# Patient Record
Sex: Female | Born: 1935 | Race: White | Hispanic: No | Smoking: Former smoker
Health system: Southern US, Community
[De-identification: ages and names within clinical notes are randomized; demographics above are authoritative.]

## PROBLEM LIST (undated history)

## (undated) DIAGNOSIS — I4891 Unspecified atrial fibrillation: Secondary | ICD-10-CM

## (undated) DIAGNOSIS — I472 Ventricular tachycardia: Secondary | ICD-10-CM

## (undated) DIAGNOSIS — I471 Supraventricular tachycardia: Secondary | ICD-10-CM

## (undated) DIAGNOSIS — H269 Unspecified cataract: Secondary | ICD-10-CM

## (undated) DIAGNOSIS — I4719 Other supraventricular tachycardia: Secondary | ICD-10-CM

## (undated) DIAGNOSIS — J45909 Unspecified asthma, uncomplicated: Secondary | ICD-10-CM

## (undated) DIAGNOSIS — I4729 Other ventricular tachycardia: Secondary | ICD-10-CM

## (undated) DIAGNOSIS — I639 Cerebral infarction, unspecified: Secondary | ICD-10-CM

## (undated) HISTORY — DX: Unspecified asthma, uncomplicated: J45.909

## (undated) HISTORY — DX: Unspecified cataract: H26.9

## (undated) HISTORY — DX: Other supraventricular tachycardia: I47.19

## (undated) HISTORY — PX: ABDOMINAL HYSTERECTOMY: SHX81

## (undated) HISTORY — DX: Other ventricular tachycardia: I47.29

## (undated) HISTORY — DX: Supraventricular tachycardia: I47.1

## (undated) HISTORY — DX: Ventricular tachycardia: I47.2

---

## 2000-11-15 ENCOUNTER — Other Ambulatory Visit: Admission: RE | Admit: 2000-11-15 | Discharge: 2000-11-15 | Payer: Self-pay | Admitting: *Deleted

## 2000-11-15 ENCOUNTER — Other Ambulatory Visit: Admission: RE | Admit: 2000-11-15 | Discharge: 2000-12-14 | Payer: Self-pay | Admitting: *Deleted

## 2000-12-09 ENCOUNTER — Ambulatory Visit (HOSPITAL_COMMUNITY): Admission: RE | Admit: 2000-12-09 | Discharge: 2000-12-09 | Payer: Self-pay | Admitting: *Deleted

## 2001-01-08 ENCOUNTER — Encounter (INDEPENDENT_AMBULATORY_CARE_PROVIDER_SITE_OTHER): Payer: Self-pay | Admitting: Specialist

## 2001-01-08 ENCOUNTER — Ambulatory Visit (HOSPITAL_COMMUNITY): Admission: RE | Admit: 2001-01-08 | Discharge: 2001-01-08 | Payer: Self-pay | Admitting: *Deleted

## 2001-07-13 ENCOUNTER — Encounter: Payer: Self-pay | Admitting: Emergency Medicine

## 2001-07-13 ENCOUNTER — Emergency Department (HOSPITAL_COMMUNITY): Admission: EM | Admit: 2001-07-13 | Discharge: 2001-07-13 | Payer: Self-pay | Admitting: Emergency Medicine

## 2001-07-18 ENCOUNTER — Encounter: Payer: Self-pay | Admitting: Orthopedic Surgery

## 2001-07-18 ENCOUNTER — Inpatient Hospital Stay (HOSPITAL_COMMUNITY): Admission: RE | Admit: 2001-07-18 | Discharge: 2001-07-19 | Payer: Self-pay | Admitting: Orthopedic Surgery

## 2001-12-15 ENCOUNTER — Ambulatory Visit (HOSPITAL_COMMUNITY): Admission: RE | Admit: 2001-12-15 | Discharge: 2001-12-15 | Payer: Self-pay | Admitting: *Deleted

## 2002-09-30 ENCOUNTER — Encounter (INDEPENDENT_AMBULATORY_CARE_PROVIDER_SITE_OTHER): Payer: Self-pay | Admitting: Specialist

## 2002-09-30 ENCOUNTER — Ambulatory Visit: Admission: RE | Admit: 2002-09-30 | Discharge: 2002-09-30 | Payer: Self-pay | Admitting: Gynecology

## 2004-04-04 ENCOUNTER — Emergency Department (HOSPITAL_COMMUNITY): Admission: EM | Admit: 2004-04-04 | Discharge: 2004-04-04 | Payer: Self-pay | Admitting: *Deleted

## 2007-11-24 ENCOUNTER — Other Ambulatory Visit: Admission: RE | Admit: 2007-11-24 | Discharge: 2007-11-24 | Payer: Self-pay | Admitting: Obstetrics and Gynecology

## 2011-02-02 NOTE — Op Note (Signed)
[facility]  Patient:    Lynn Hardy, Lynn Hardy Visit Number: [id number] MRN: [id number]          Service Type: SUR Location: 4W [id number] Attending Physician:  Sherri Rad Proc. Date: 07/18/01 Admit Date:  07/18/2001 Discharge Date: 07/19/2001                             Operative Report  PREOPERATIVE DIAGNOSIS:  Comminuted intraarticular right proximal phalanx great toe fracture.  POSTOPERATIVE DIAGNOSIS:  Comminuted intraarticular right proximal phalanx great toe fracture.  OPERATION:  Open reduction, internal fixation right proximal phalanx great toe intraarticular fracture.  ANESTHESIA:  General endotracheal tube.  SURGEON:  Sherri Rad, M.D.  ASSISTANT:  Ottie Glazier. Wynona Neat, P.A.-C.  ESTIMATED BLOOD LOSS:  Minimal.  TOURNIQUET TIME:  37 minutes.  COMPLICATIONS:  None.  DISPOSITION:  Stable to PR.  INDICATIONS:  This 77 year old female, who slipped and fell and had immediate pain and swelling in her right great toe.  She was seen in the office where she was evaluated and x-rays were studied and showed that she had a comminuted intraarticular proximal phalanx fracture.  There was a fragment that was plantar lateral that was significantly displaced intraarticular.  She was consented for the above procedure, and all risks which included infection neurovascular injury, malunion, nonunion, arthritis, persistent pain, and stiffness were all explained.  Questions were answered.  DESCRIPTION OF PROCEDURE:  The patient was brought to the operating room and placed in the supine position.  After adequate general endotracheal tube anesthesia was administered as well as Ancef 1 g IV piggyback, the right lower extremity was then prepped and draped in a sterile manner over a proximally-placed thigh tourniquet.  The tourniquet was gravity-exsanguinated, and the tourniquet was elevated to 290 mmHg.  A longitudinal incision on  the dorsolateral aspect of the proximal phalanx was then made.  Dissection was carried down bluntly to bone using a freer elevator as well as a mosquito clamp.  The fragment was identified by C-arm guidance in the oblique plane using a freer.  Then a 2. Pearlean Brownie reduction clamp was then placed at the area of the fracture site under C-arm guidance.  A percutaneous incision was then made on the dorsomedial aspect of the proximal phalanx, and the Pearlean Brownie reduction clamp was then anchored in this area.  The incision dorsomedial was then made. Dissection was carried down to the proximal phalanx under C-arm guidance with a mosquito clamp.  Once this was done, a cannulated 1.1 mm threaded K-wire was then placed for attenuated screw.  Once this was placed under C-arm guidance with the wire perpendicular to the C-arm device in the AP and lateral planes with the fracture reduced with the 2. reduction clamp, this was then measured to be 16 mm x 16 mm 3.0 meter cannulated Synthese screw was then placed.  This was then placed under C-arm guidance in AP, lateral, and oblique planes.  This held the joint reduced nicely.  We then copiously irrigated the wound with normal saline.  K-wire was removed.  Final x-rays were obtained in the AP, lateral, and oblique planes.  We closed the skin with 4-0 nylon.  Sterile dressing was applied.  Hard sole shoe was applied.  The patient was stable. Attending Physician:  Sherri Rad DD:  07/18/01 TD:  07/21/01 Job: [id number]

## 2011-02-02 NOTE — H&P (Signed)
[facility]  Patient:    Lynn Hardy, Lynn Hardy Visit Number: [id number] MRN: [id number]          Service Type: Attending:  Sherri Rad, M.D. Dictated by:   Marcie Bal Troncale, P.A.C. Adm. Date:  07/18/01                           History and Physical  DATE OF BIRTH:  07-20-1931  SSN:  [id number]  CHIEF COMPLAINT:  Right great toe injury.  HISTORY OF PRESENT ILLNESS:  Lynn Hardy is a 77 year old female who tripped in her driveway on July 12, 2001, sustaining an injury to her right great toe.  She was seen the following day in the emergency department and had x-rays obtained.  She was referred over to Dr. Henrietta Dine office for further evaluation and treatment.  X-rays demonstrated a multiplantar fracture of the right great toe proximal phalanx.  Because of the nature of the fracture it was recommended she undergo surgical intervention.  REVIEW OF SYSTEMS:  She denies any recent fever or chills.  No diplopia, blurred vision, or headaches.  No rhinorrhea, sore throat, or earaches.  No chest pain, shortness of breath, or cough.  No abdominal pian, nausea, vomiting, diarrhea, or constipation.  No melena or bright red blood per rectum.  No urinary frequency, dysuria, or hematuria.  No numbness or tingling in her extremities.  PAST MEDICAL HISTORY:  Significant for hypertension and local skin cancer that was removed recently.  Denies strokes, seizures, diabetes, coronary artery disease, and lung or kidney disease.  PAST SURGICAL HISTORY:  Hysterectomy and tonsillectomy.  ALLERGIES:  No known drug allergies.  CURRENT MEDICATIONS:  Enalapril 5 mg q.d.  FAMILY HISTORY:  Significant for an MI in her mother at age 60.  SOCIAL HISTORY:  The patient is divorced.  She has two adult children who are alive and well.  She is retired.  She quit smoking about seven years ago.  No alcohol use.  Her primary medical physician is Dr.  Johnsie Kindred.  PHYSICAL EXAMINATION:  VITAL SIGNS:  She is afebrile.  Pulse is 56, respiratory 18, blood pressure 150/72.  GENERAL:  This is a well-developed, well-nourished female in no acute distress.  HEENT:  Head is atraumatic, normocephalic.  Pupils are equal, round, and reactive to light.  Extraocular movements are grossly intact.  Oropharynx is clear without redness, exudate, or lesions.  NECK:  Supple with no cervical lymphadenopathy.  CHEST:  Clear to auscultation bilaterally with no wheezes or crackles.  HEART:  Regular rate and rhythm with a 2/6 systolic ejection murmur.  ABDOMEN:  Soft, nontender, nondistended with no masses, no hepatosplenomegaly.  BREASTS/GENITOURINARY:  Not examined, not pertinent to present illness.  SKIN:  Intact without rashes, lesions, or abrasions.  Cap refill less than two seconds.  EXTREMITIES:  Toe is with ecchymosis and swelling, but no clinical deformity.  IMPRESSION: 1. Right proximal phalanx, great toe fracture. 2. Hypertension.  PLAN:  The patient will be admitted to [facility] to undergo an open reduction internal fixation, right great toe, proximal phalanx fracture by Dr. Lestine Box on July 18, 2001.  All questions have been encouraged and answered. ictated by:   Marcie Bal Troncale, P.A.C. Attending:  Sherri Rad, M.D. DD:  07/17/01 TD:  07/17/01 Job: [id number]

## 2011-02-02 NOTE — Consult Note (Signed)
NAME:  Lynn Hardy, Lynn Hardy                ACCOUNT NO.:  [id number]   MEDICAL RECORD NO.:  [id number]                   PATIENT TYPE:  OUT   LOCATION:  GYN                                  FACILITY:  [facility]   PHYSICIAN:  De Blanch, M.D.         DATE OF BIRTH:  07/29/1931   DATE OF CONSULTATION:  09/30/2002  DATE OF DISCHARGE:                                   CONSULTATION   HISTORY OF PRESENT ILLNESS:  The patient is a 77 year old who was seen in  consultation at the request of Dr. Myrlene Broker regarding vulvar and perianal  lesions.  The patient apparently developed lesions of the vulva and perianal  region approximately a year ago.  On initial evaluation she was thought to  have condylomata acuminata and was treated with trichloracetic acid weekly  in March and April of 2003.  She had some cryotherapy of residual lesions on  the vulva and perianal region in May 2003.  Additional treatment with TCA in  June resulted in good response to the vulvar lesions except for an area of  perirectal abnormality.  Biopsies were obtained in March and May, revealing  condylomata.  A subsequent biopsy in August 2003 revealed only nonsquamous  hyperplasia and hyperkeratosis with no evidence of  dysplasia.  Subsequently, the patient was treated with Temovate with apparent partial  response to the region in the perianal area.   The patient herself is entirely asymptomatic, denying any vulvar pain,  pruritus, or any other symptoms.   PAST MEDICAL HISTORY:  Hypertension.  The remainder of the patient's past  medical history is negative.   CURRENT MEDICATIONS:  Enalapril.   FAMILY HISTORY:  Cutaneous papillomas, and the patient herself has had some  removed.   SOCIAL HISTORY:  The patient has been divorced for 23 years.   PHYSICAL EXAMINATION:  VITAL SIGNS:  Weight 200 pounds.  Blood pressure  156/90, pulse 76, respiratory rate 20.  Height 5 feet 2 inches.  GENERAL:  Healthy white  female in no acute distress.  NECK:  Supple.  Without thyromegaly.  There is no supraclavicular or  inguinal adenopathy.  ABDOMEN:  Soft and nontender.  No masses, organomegaly, ascites, or hernias  are noted.  PELVIC:  EGBUS appears essentially normal.  There is, however, a small area  of white condylomata measuring approximately 5 x 7 mm on the anterior right  labia minora near the clitoris.  The remainder of the vulva is normal.  In  the perianal region, however, essentially posterior to the anus and along  the natal cleft is an erythematous plaque-like series of lesions that seem  to be in some areas confluent and in others separate.  These do not extend  into the anus.  The vagina is normal.   PROCEDURE NOTE:  After preparation with alcohol, the skin was infiltrated  with 1% Xylocaine in the perianal region, and a punch biopsy was obtained.  Hemostasis was achieved with silver nitrate.  IMPRESSION:  Unusual posterior perianal lesion of questionable origin;  therefore, a biopsy has been obtained.  We will await these results before  further recommendations.   With regard to the small area of the anterior right labia minora, I believe  this is a condylomata which could be treated as previously by Dr. Katrinka Blazing.                                                 De Blanch, M.D.    DC/MEDQ  D:  09/30/2002  T:  09/30/2002  Job:  [id number]   cc:   Laqueta Linden, M.D.  [address]  [place]  [place] [address]  Fax: [phone]   Telford Nab, R.N.  [address] [place], [place] [address]  Fax: 1

## 2011-02-02 NOTE — Procedures (Signed)
[facility]  Patient:    Lynn Hardy, Lynn Hardy                  MRN: [id number] Proc. Date: 01/08/01 Adm. Date:  [id number] Attending:  Sabino Gasser                           Procedure Report  PROCEDURE:  Colonoscopy with polypectomy.  INDICATIONS FOR PROCEDURE:  Colon polyp.  ANESTHESIA:  Demerol 70, Versed 6.5 mg.  DESCRIPTION OF PROCEDURE:  With the patient mildly sedated in the left lateral decubitus position, the Olympus videoscopic colonoscope was inserted in the rectum and passed under direct vision to the cecum identified by the ileocecal valve and appendiceal orifice. From this point, the colonoscope was slowly withdrawn after taking a look at the terminal ileum as well which appeared normal. From this point, the colonoscope was then slowly withdrawn taking circumferential views of the entire colonic mucosa stopping then only in the rectum. Once in the sigmoid at approximately 20 cm from the anal verge at which point a polyp was seen approximately 1 cm in size photographed and removed using snare cautery technique on a setting of 20:20 blended current. There was good hemostasis and the polyp was removed and tissue obtained. The endoscope was then withdrawn to the rectum which appeared normal in direct and showed internal hemorrhoids in retroflexed view. The endoscope was straightened and withdrawn. The patients vital signs and pulse oximeter remained stable. The patient tolerated the procedure well without apparent complications.  FINDINGS:  Polyp at 20 cm from the anal verge. Await biopsy report. The patient will call me for results and followup with me in as an outpatient. DD:  01/08/01 TD:  01/08/01 Job: [id number]

## 2011-10-01 DIAGNOSIS — Z23 Encounter for immunization: Secondary | ICD-10-CM | POA: Diagnosis not present

## 2011-11-01 DIAGNOSIS — R5383 Other fatigue: Secondary | ICD-10-CM | POA: Diagnosis not present

## 2011-11-01 DIAGNOSIS — I10 Essential (primary) hypertension: Secondary | ICD-10-CM | POA: Diagnosis not present

## 2011-11-01 DIAGNOSIS — N390 Urinary tract infection, site not specified: Secondary | ICD-10-CM | POA: Diagnosis not present

## 2011-11-01 DIAGNOSIS — E119 Type 2 diabetes mellitus without complications: Secondary | ICD-10-CM | POA: Diagnosis not present

## 2011-11-01 DIAGNOSIS — Z0000 Encounter for general adult medical examination without abnormal findings: Secondary | ICD-10-CM | POA: Diagnosis not present

## 2011-11-01 DIAGNOSIS — R5381 Other malaise: Secondary | ICD-10-CM | POA: Diagnosis not present

## 2011-11-08 DIAGNOSIS — M999 Biomechanical lesion, unspecified: Secondary | ICD-10-CM | POA: Diagnosis not present

## 2011-11-08 DIAGNOSIS — M5137 Other intervertebral disc degeneration, lumbosacral region: Secondary | ICD-10-CM | POA: Diagnosis not present

## 2011-11-21 DIAGNOSIS — E119 Type 2 diabetes mellitus without complications: Secondary | ICD-10-CM | POA: Diagnosis not present

## 2011-11-21 DIAGNOSIS — E782 Mixed hyperlipidemia: Secondary | ICD-10-CM | POA: Diagnosis not present

## 2011-11-21 DIAGNOSIS — I10 Essential (primary) hypertension: Secondary | ICD-10-CM | POA: Diagnosis not present

## 2011-11-21 DIAGNOSIS — H908 Mixed conductive and sensorineural hearing loss, unspecified: Secondary | ICD-10-CM | POA: Diagnosis not present

## 2011-11-21 DIAGNOSIS — I052 Rheumatic mitral stenosis with insufficiency: Secondary | ICD-10-CM | POA: Diagnosis not present

## 2011-12-04 DIAGNOSIS — M999 Biomechanical lesion, unspecified: Secondary | ICD-10-CM | POA: Diagnosis not present

## 2011-12-04 DIAGNOSIS — M5137 Other intervertebral disc degeneration, lumbosacral region: Secondary | ICD-10-CM | POA: Diagnosis not present

## 2012-01-01 DIAGNOSIS — Z961 Presence of intraocular lens: Secondary | ICD-10-CM | POA: Diagnosis not present

## 2012-02-18 DIAGNOSIS — M5137 Other intervertebral disc degeneration, lumbosacral region: Secondary | ICD-10-CM | POA: Diagnosis not present

## 2012-02-18 DIAGNOSIS — M999 Biomechanical lesion, unspecified: Secondary | ICD-10-CM | POA: Diagnosis not present

## 2012-02-20 DIAGNOSIS — R5381 Other malaise: Secondary | ICD-10-CM | POA: Diagnosis not present

## 2012-02-20 DIAGNOSIS — E119 Type 2 diabetes mellitus without complications: Secondary | ICD-10-CM | POA: Diagnosis not present

## 2012-02-20 DIAGNOSIS — I10 Essential (primary) hypertension: Secondary | ICD-10-CM | POA: Diagnosis not present

## 2012-02-20 DIAGNOSIS — R5383 Other fatigue: Secondary | ICD-10-CM | POA: Diagnosis not present

## 2012-02-20 DIAGNOSIS — E782 Mixed hyperlipidemia: Secondary | ICD-10-CM | POA: Diagnosis not present

## 2012-02-27 DIAGNOSIS — I052 Rheumatic mitral stenosis with insufficiency: Secondary | ICD-10-CM | POA: Diagnosis not present

## 2012-02-27 DIAGNOSIS — M159 Polyosteoarthritis, unspecified: Secondary | ICD-10-CM | POA: Diagnosis not present

## 2012-02-27 DIAGNOSIS — E782 Mixed hyperlipidemia: Secondary | ICD-10-CM | POA: Diagnosis not present

## 2012-02-27 DIAGNOSIS — I10 Essential (primary) hypertension: Secondary | ICD-10-CM | POA: Diagnosis not present

## 2012-03-10 DIAGNOSIS — M5137 Other intervertebral disc degeneration, lumbosacral region: Secondary | ICD-10-CM | POA: Diagnosis not present

## 2012-03-10 DIAGNOSIS — M999 Biomechanical lesion, unspecified: Secondary | ICD-10-CM | POA: Diagnosis not present

## 2012-05-13 DIAGNOSIS — D235 Other benign neoplasm of skin of trunk: Secondary | ICD-10-CM | POA: Diagnosis not present

## 2012-05-13 DIAGNOSIS — D1801 Hemangioma of skin and subcutaneous tissue: Secondary | ICD-10-CM | POA: Diagnosis not present

## 2012-05-13 DIAGNOSIS — L820 Inflamed seborrheic keratosis: Secondary | ICD-10-CM | POA: Diagnosis not present

## 2012-05-21 DIAGNOSIS — Z961 Presence of intraocular lens: Secondary | ICD-10-CM | POA: Diagnosis not present

## 2012-05-21 DIAGNOSIS — M999 Biomechanical lesion, unspecified: Secondary | ICD-10-CM | POA: Diagnosis not present

## 2012-05-21 DIAGNOSIS — M5137 Other intervertebral disc degeneration, lumbosacral region: Secondary | ICD-10-CM | POA: Diagnosis not present

## 2012-06-25 DIAGNOSIS — M5137 Other intervertebral disc degeneration, lumbosacral region: Secondary | ICD-10-CM | POA: Diagnosis not present

## 2012-06-25 DIAGNOSIS — M999 Biomechanical lesion, unspecified: Secondary | ICD-10-CM | POA: Diagnosis not present

## 2012-07-14 DIAGNOSIS — M5137 Other intervertebral disc degeneration, lumbosacral region: Secondary | ICD-10-CM | POA: Diagnosis not present

## 2012-07-14 DIAGNOSIS — M999 Biomechanical lesion, unspecified: Secondary | ICD-10-CM | POA: Diagnosis not present

## 2012-07-28 DIAGNOSIS — I10 Essential (primary) hypertension: Secondary | ICD-10-CM | POA: Diagnosis not present

## 2012-07-28 DIAGNOSIS — N393 Stress incontinence (female) (male): Secondary | ICD-10-CM | POA: Diagnosis not present

## 2012-07-28 DIAGNOSIS — E782 Mixed hyperlipidemia: Secondary | ICD-10-CM | POA: Diagnosis not present

## 2012-08-04 DIAGNOSIS — R5381 Other malaise: Secondary | ICD-10-CM | POA: Diagnosis not present

## 2012-08-04 DIAGNOSIS — R5383 Other fatigue: Secondary | ICD-10-CM | POA: Diagnosis not present

## 2012-08-04 DIAGNOSIS — E782 Mixed hyperlipidemia: Secondary | ICD-10-CM | POA: Diagnosis not present

## 2012-08-04 DIAGNOSIS — Z23 Encounter for immunization: Secondary | ICD-10-CM | POA: Diagnosis not present

## 2012-08-04 DIAGNOSIS — E119 Type 2 diabetes mellitus without complications: Secondary | ICD-10-CM | POA: Diagnosis not present

## 2012-08-04 DIAGNOSIS — I10 Essential (primary) hypertension: Secondary | ICD-10-CM | POA: Diagnosis not present

## 2012-08-22 DIAGNOSIS — M999 Biomechanical lesion, unspecified: Secondary | ICD-10-CM | POA: Diagnosis not present

## 2012-08-22 DIAGNOSIS — M5137 Other intervertebral disc degeneration, lumbosacral region: Secondary | ICD-10-CM | POA: Diagnosis not present

## 2012-08-26 DIAGNOSIS — R002 Palpitations: Secondary | ICD-10-CM | POA: Diagnosis not present

## 2012-08-28 ENCOUNTER — Ambulatory Visit (HOSPITAL_COMMUNITY)
Admission: RE | Admit: 2012-08-28 | Discharge: 2012-08-28 | Disposition: A | Discharge status: Home / Self Care | Payer: Medicare Other | Source: Ambulatory Visit | Attending: Cardiovascular Disease | Admitting: Cardiovascular Disease

## 2012-08-28 ENCOUNTER — Other Ambulatory Visit (HOSPITAL_COMMUNITY): Payer: Self-pay | Admitting: Cardiovascular Disease

## 2012-08-28 DIAGNOSIS — I059 Rheumatic mitral valve disease, unspecified: Secondary | ICD-10-CM | POA: Diagnosis not present

## 2012-08-28 DIAGNOSIS — R011 Cardiac murmur, unspecified: Secondary | ICD-10-CM

## 2012-08-28 DIAGNOSIS — Z8249 Family history of ischemic heart disease and other diseases of the circulatory system: Secondary | ICD-10-CM | POA: Diagnosis not present

## 2012-08-28 DIAGNOSIS — I10 Essential (primary) hypertension: Secondary | ICD-10-CM | POA: Diagnosis not present

## 2012-08-28 DIAGNOSIS — I079 Rheumatic tricuspid valve disease, unspecified: Secondary | ICD-10-CM | POA: Insufficient documentation

## 2012-08-28 NOTE — Progress Notes (Signed)
2D Echo Performed 08/28/2012    Kinzey Sheriff, RCS  

## 2012-09-08 DIAGNOSIS — R011 Cardiac murmur, unspecified: Secondary | ICD-10-CM | POA: Diagnosis not present

## 2012-09-30 DIAGNOSIS — I10 Essential (primary) hypertension: Secondary | ICD-10-CM | POA: Diagnosis not present

## 2012-09-30 DIAGNOSIS — I472 Ventricular tachycardia: Secondary | ICD-10-CM | POA: Diagnosis not present

## 2012-10-06 ENCOUNTER — Other Ambulatory Visit (HOSPITAL_COMMUNITY): Payer: Self-pay | Admitting: Cardiovascular Disease

## 2012-10-06 DIAGNOSIS — I472 Ventricular tachycardia: Secondary | ICD-10-CM

## 2012-10-06 DIAGNOSIS — I471 Supraventricular tachycardia: Secondary | ICD-10-CM

## 2012-10-10 ENCOUNTER — Ambulatory Visit (HOSPITAL_COMMUNITY)
Admission: RE | Admit: 2012-10-10 | Discharge: 2012-10-10 | Disposition: A | Discharge status: Home / Self Care | Payer: Medicare Other | Source: Ambulatory Visit | Attending: Cardiovascular Disease | Admitting: Cardiovascular Disease

## 2012-10-10 DIAGNOSIS — R0789 Other chest pain: Secondary | ICD-10-CM | POA: Insufficient documentation

## 2012-10-10 DIAGNOSIS — I472 Ventricular tachycardia: Secondary | ICD-10-CM

## 2012-10-10 DIAGNOSIS — I10 Essential (primary) hypertension: Secondary | ICD-10-CM | POA: Insufficient documentation

## 2012-10-10 DIAGNOSIS — R55 Syncope and collapse: Secondary | ICD-10-CM | POA: Insufficient documentation

## 2012-10-10 DIAGNOSIS — R42 Dizziness and giddiness: Secondary | ICD-10-CM | POA: Insufficient documentation

## 2012-10-10 DIAGNOSIS — I471 Supraventricular tachycardia: Secondary | ICD-10-CM

## 2012-10-10 MED ORDER — REGADENOSON 0.4 MG/5ML IV SOLN
0.4000 mg | Freq: Once | INTRAVENOUS | Status: AC
  Administered 2012-10-10: 0.4 mg via INTRAVENOUS

## 2012-10-10 MED ORDER — TECHNETIUM TC 99M SESTAMIBI GENERIC - CARDIOLITE
31.0000 | Freq: Once | INTRAVENOUS | Status: AC | PRN
  Administered 2012-10-10: 31 via INTRAVENOUS

## 2012-10-10 MED ORDER — TECHNETIUM TC 99M SESTAMIBI GENERIC - CARDIOLITE
11.0000 | Freq: Once | INTRAVENOUS | Status: AC | PRN
  Administered 2012-10-10: 11 via INTRAVENOUS

## 2012-10-10 NOTE — Procedures (Addendum)
[facility] [address] [place] [address] [place] [address] [phone]  Cardiology Nuclear Med Study  Lynn Hardy is a 79 y.o. female     MRN : [id number]     DOB: 10/25/1930  Procedure Date: 10/10/2012  Nuclear Med Background Indication for Stress Test:  Evaluation for Ischemia History:  no prior cardiac history Cardiac Risk Factors: History of Smoking, Hypertension and Obesity  Symptoms:  Dizziness, Near Syncope and chest heaviness   Nuclear Pre-Procedure Caffeine/Decaff Intake:  7:00pm NPO After: 5:00am   IV Site: R Antecubital  IV 0.9% NS with Angio Cath:  22g  Chest Size (in):  n/a IV Started by: Koren Shiver, CNMT  Height: 5\' 2"  (1.575 m)  Cup Size: C  BMI:  Body mass index is 38.04 kg/(m^2). Weight:  208 lb (94.348 kg)   Tech Comments:  n/a    Nuclear Med Study 1 or 2 day study: 1 day  Stress Test Type:  Lexiscan  Order Authorizing Provider:  Thurmon Fair, MD   Resting Radionuclide: Technetium 64m Sestamibi  Resting Radionuclide Dose: 11.0 mCi   Stress Radionuclide:  Technetium 19m Sestamibi  Stress Radionuclide Dose: 31.0 mCi           Stress Protocol Rest HR: 67 Stress HR: 85  Rest BP: 167/101 Stress BP: 183/98  Exercise Time (min): n/a METS: n/a   Predicted Max HR: 141 bpm % Max HR: 60.28 bpm Rate Pressure Product: 60630   Dose of Adenosine (mg):  n/a Dose of Lexiscan: 0.4 mg  Dose of Atropine (mg): n/a Dose of Dobutamine: n/a mcg/kg/min (at max HR)  Stress Test Technologist: Esperanza Sheets, CCT Nuclear Technologist: Gonzella Lex, CNMT   Rest Procedure:  Myocardial perfusion imaging was performed at rest 45 minutes following the intravenous administration of Technetium 29m Sestamibi. Stress Procedure:  The patient received IV Lexiscan 0.4 mg over 15-seconds.  Technetium 51m Sestamibi injected at 30-seconds.  There were no significant changes with Lexiscan.  Quantitative spect images were  obtained after a 45 minute delay.  Transient Ischemic Dilatation (Normal <1.22):  1.10 Lung/Heart Ratio (Normal <0.45):  0.25 QGS EDV:  73 ml QGS ESV:  24 ml LV Ejection Fraction: 67%     Rest ECG: NSR - Normal EKG  Stress ECG: No significant change from baseline ECG  QPS Raw Data Images:  Mild diaphragmatic attenuation.  Normal left ventricular size. Stress Images:  There is decreased uptake in the inferior wall. Rest Images:  Comparison with the stress images reveals no significant change. Subtraction (SDS):  No evidence of ischemia.  Impression Exercise Capacity:  Lexiscan with no exercise. BP Response:  Hypertensive blood pressure response. Clinical Symptoms:  No significant symptoms noted. ECG Impression:  No significant ST segment change suggestive of ischemia. Comparison with Prior Nuclear Study: No previous nuclear study performed  Overall Impression:  Normal stress nuclear study. Mild diaphragmatic attenuation artifact.  LV Wall Motion:  NL LV Function; NL Wall Motion   Alezander Dimaano, MD  10/10/2012 12:21 PM

## 2012-11-11 DIAGNOSIS — I10 Essential (primary) hypertension: Secondary | ICD-10-CM | POA: Diagnosis not present

## 2012-11-11 DIAGNOSIS — I472 Ventricular tachycardia: Secondary | ICD-10-CM | POA: Diagnosis not present

## 2012-11-11 DIAGNOSIS — I498 Other specified cardiac arrhythmias: Secondary | ICD-10-CM | POA: Diagnosis not present

## 2012-11-11 DIAGNOSIS — M999 Biomechanical lesion, unspecified: Secondary | ICD-10-CM | POA: Diagnosis not present

## 2012-11-11 DIAGNOSIS — M5137 Other intervertebral disc degeneration, lumbosacral region: Secondary | ICD-10-CM | POA: Diagnosis not present

## 2012-11-17 DIAGNOSIS — E119 Type 2 diabetes mellitus without complications: Secondary | ICD-10-CM | POA: Diagnosis not present

## 2012-11-17 DIAGNOSIS — I10 Essential (primary) hypertension: Secondary | ICD-10-CM | POA: Diagnosis not present

## 2012-11-17 DIAGNOSIS — E782 Mixed hyperlipidemia: Secondary | ICD-10-CM | POA: Diagnosis not present

## 2012-11-17 DIAGNOSIS — Z0000 Encounter for general adult medical examination without abnormal findings: Secondary | ICD-10-CM | POA: Diagnosis not present

## 2012-11-17 DIAGNOSIS — R5381 Other malaise: Secondary | ICD-10-CM | POA: Diagnosis not present

## 2012-11-17 DIAGNOSIS — Z1331 Encounter for screening for depression: Secondary | ICD-10-CM | POA: Diagnosis not present

## 2012-11-17 DIAGNOSIS — N951 Menopausal and female climacteric states: Secondary | ICD-10-CM | POA: Diagnosis not present

## 2012-11-17 DIAGNOSIS — R5383 Other fatigue: Secondary | ICD-10-CM | POA: Diagnosis not present

## 2012-12-12 DIAGNOSIS — N951 Menopausal and female climacteric states: Secondary | ICD-10-CM | POA: Diagnosis not present

## 2012-12-12 DIAGNOSIS — Z23 Encounter for immunization: Secondary | ICD-10-CM | POA: Diagnosis not present

## 2012-12-12 DIAGNOSIS — I10 Essential (primary) hypertension: Secondary | ICD-10-CM | POA: Diagnosis not present

## 2012-12-12 DIAGNOSIS — H908 Mixed conductive and sensorineural hearing loss, unspecified: Secondary | ICD-10-CM | POA: Diagnosis not present

## 2012-12-12 DIAGNOSIS — E782 Mixed hyperlipidemia: Secondary | ICD-10-CM | POA: Diagnosis not present

## 2012-12-12 DIAGNOSIS — M159 Polyosteoarthritis, unspecified: Secondary | ICD-10-CM | POA: Diagnosis not present

## 2012-12-12 DIAGNOSIS — E119 Type 2 diabetes mellitus without complications: Secondary | ICD-10-CM | POA: Diagnosis not present

## 2013-01-07 DIAGNOSIS — M999 Biomechanical lesion, unspecified: Secondary | ICD-10-CM | POA: Diagnosis not present

## 2013-01-07 DIAGNOSIS — M5137 Other intervertebral disc degeneration, lumbosacral region: Secondary | ICD-10-CM | POA: Diagnosis not present

## 2013-01-09 DIAGNOSIS — M5137 Other intervertebral disc degeneration, lumbosacral region: Secondary | ICD-10-CM | POA: Diagnosis not present

## 2013-01-09 DIAGNOSIS — M999 Biomechanical lesion, unspecified: Secondary | ICD-10-CM | POA: Diagnosis not present

## 2013-01-19 ENCOUNTER — Encounter: Payer: Self-pay | Admitting: Cardiovascular Disease

## 2013-02-02 DIAGNOSIS — M999 Biomechanical lesion, unspecified: Secondary | ICD-10-CM | POA: Diagnosis not present

## 2013-02-02 DIAGNOSIS — M5137 Other intervertebral disc degeneration, lumbosacral region: Secondary | ICD-10-CM | POA: Diagnosis not present

## 2013-02-04 ENCOUNTER — Other Ambulatory Visit: Payer: Self-pay | Admitting: Cardiovascular Disease

## 2013-02-04 DIAGNOSIS — M5137 Other intervertebral disc degeneration, lumbosacral region: Secondary | ICD-10-CM | POA: Diagnosis not present

## 2013-02-04 DIAGNOSIS — M999 Biomechanical lesion, unspecified: Secondary | ICD-10-CM | POA: Diagnosis not present

## 2013-02-06 DIAGNOSIS — M999 Biomechanical lesion, unspecified: Secondary | ICD-10-CM | POA: Diagnosis not present

## 2013-02-06 DIAGNOSIS — M5137 Other intervertebral disc degeneration, lumbosacral region: Secondary | ICD-10-CM | POA: Diagnosis not present

## 2013-02-18 DIAGNOSIS — M999 Biomechanical lesion, unspecified: Secondary | ICD-10-CM | POA: Diagnosis not present

## 2013-02-18 DIAGNOSIS — M5137 Other intervertebral disc degeneration, lumbosacral region: Secondary | ICD-10-CM | POA: Diagnosis not present

## 2013-05-06 ENCOUNTER — Encounter: Payer: Self-pay | Admitting: Cardiology

## 2013-05-08 ENCOUNTER — Ambulatory Visit (INDEPENDENT_AMBULATORY_CARE_PROVIDER_SITE_OTHER): Payer: Medicare Other | Admitting: Cardiovascular Disease

## 2013-05-08 ENCOUNTER — Encounter: Payer: Self-pay | Admitting: Cardiovascular Disease

## 2013-05-08 VITALS — BP 160/100 | HR 74 | Resp 16 | Ht 62.0 in | Wt 214.0 lb

## 2013-05-08 DIAGNOSIS — R609 Edema, unspecified: Secondary | ICD-10-CM

## 2013-05-08 DIAGNOSIS — I471 Supraventricular tachycardia: Secondary | ICD-10-CM

## 2013-05-08 DIAGNOSIS — I10 Essential (primary) hypertension: Secondary | ICD-10-CM | POA: Diagnosis not present

## 2013-05-08 DIAGNOSIS — I472 Ventricular tachycardia: Secondary | ICD-10-CM | POA: Diagnosis not present

## 2013-05-08 DIAGNOSIS — I498 Other specified cardiac arrhythmias: Secondary | ICD-10-CM

## 2013-05-08 MED ORDER — HYDROCHLOROTHIAZIDE 12.5 MG PO CAPS: 12.5000 mg | ORAL_CAPSULE | Freq: Every day | ORAL | Status: DC

## 2013-05-08 NOTE — Assessment & Plan Note (Signed)
Incidentally discovered on event monitor, asymptomatic, normal echo and nuclear stress test.

## 2013-05-08 NOTE — Assessment & Plan Note (Addendum)
Her blood pressure is high and this appears to be a consistent problem, even outside of the current illness. I have added hydrochlorothiazide 12.5 mg once daily. She has a followup appointment with her primary care physician in one month. That will be an optimal opportunity to reevaluate her blood pressure. Also discussed with her the importance of avoiding sodium rich foods such as a Campbell[employer] chicken soup that she is currently eating. She should increase her intake of potassium rich foods to avoid hydrochlorothiazide-induced hypokalemia.

## 2013-05-08 NOTE — Assessment & Plan Note (Signed)
No symptomatic events. 

## 2013-05-08 NOTE — Patient Instructions (Addendum)
Your physician recommends that you schedule a follow-up appointment in: 12 months Your physician has recommended you make the following change in your medication: start hydrochlorothiazide 12.5 mg daily. Try to avoid sodium rich food and added salt. Increase intake of potassium rich foods, such as fruit and vegetables.

## 2013-05-08 NOTE — Progress Notes (Signed)
Patient ID: Lynn Hardy, female   DOB: 1931-05-27, 79 y.o.   MRN: [id number]     Reason for office visit Followup presyncope, arrhythmia, hypertension  Lynn Hardy had an episode of sudden severe weakness and presyncope that occurred roughly one year ago. At that time she had a normal echo and normal nuclear stress test. A 30 day event monitor shows bursts of ectopic atrial tachycardia as well as a few episodes of nonsustained ventricular tachycardia (the longest consisting of only about 10 beats). None of the arrhythmic events was associated with symptoms wash was wearing the monitor.  At that time we switched her antihypertensive medication to a beta blocker. She has generally done well. She has not had any episodes of presyncope. She is not troubled by palpitations. She is currently suffering from a "chest cold" and is coughing up thick mucus but is not particularly dyspneic. She only feels short of breath when she "rushes around". She has not had wheezing even though in the past she was diagnosed with asthma. She denies problems with chest pain or exertional dizziness or shortness of breath. To treat her chest cold, she has been eating large quantities of Campbell's chicken soup. She has noticed that her ankles and feet are swollen at the end of the day.  Her blood pressure at home is always high although not as high as was initially recorded today. Her systolic blood pressures never less than 140 and more commonly is around 150.    No Known Allergies  Current Outpatient Prescriptions  Medication Sig Dispense Refill  . acetaminophen (TYLENOL) 325 MG tablet Take 650 mg by mouth every 6 (six) hours as needed for pain.      Marland Kitchen b complex vitamins tablet Take 1 tablet by mouth daily.      . calcium-vitamin D (OSCAL WITH D) 500-200 MG-UNIT per tablet Take 1 tablet by mouth.      . fish oil-omega-3 fatty acids 1000 MG capsule Take 1 g by mouth daily.      . Garlic (GARLIQUE PO) Take  by mouth.      . vitamin E 400 UNIT capsule Take 400 Units by mouth daily.      . carvedilol (COREG) 6.25 MG tablet take 1 tablet by mouth twice a day  60 tablet  6  . hydrochlorothiazide (MICROZIDE) 12.5 MG capsule Take 1 capsule (12.5 mg total) by mouth daily.  90 capsule  3   No current facility-administered medications for this visit.    Past Medical History  Diagnosis Date  . PAT (paroxysmal atrial tachycardia)   . NSVT (nonsustained ventricular tachycardia)     No past surgical history on file.  Family History  Problem Relation Age of Onset  . Tuberculosis Father 13    History   Social History  . Marital Status: Divorced    Spouse Name: N/A    Number of Children: N/A  . Years of Education: N/A   Occupational History  . Not on file.   Social History Main Topics  . Smoking status: Former Smoker    Quit date: 01/15/1998  . Smokeless tobacco: Not on file  . Alcohol Use: No  . Drug Use: No  . Sexual Activity: Not on file   Other Topics Concern  . Not on file   Social History Narrative  . No narrative on file    Review of systems: The patient specifically denies any chest pain at rest or with exertion, dyspnea at rest, orthopnea,  paroxysmal nocturnal dyspnea, syncope, palpitations, focal neurological deficits, intermittent claudication, lower extremity edema, unexplained weight gain, cough, hemoptysis or wheezing.  The patient also denies abdominal pain, nausea, vomiting, dysphagia, diarrhea, constipation, polyuria, polydipsia, dysuria, hematuria, frequency, urgency, abnormal bleeding or bruising, fever, chills, unexpected weight changes, mood swings, change in skin or hair texture, change in voice quality, auditory or visual problems, allergic reactions or rashes, new musculoskeletal complaints other than usual "aches and pains".   PHYSICAL EXAM BP 160/100  Pulse 74  Resp 16  Ht 5\' 2"  (1.575 m)  Wt 214 lb (97.07 kg)  BMI 39.13 kg/m2  General: Alert,  oriented x3, no distress Head: no evidence of trauma, PERRL, EOMI, no exophtalmos or lid lag, no myxedema, no xanthelasma; normal ears, nose and oropharynx Neck: normal jugular venous pulsations and no hepatojugular reflux; brisk carotid pulses without delay and no carotid bruits Chest: clear to auscultation, no signs of consolidation by percussion or palpation, normal fremitus, symmetrical and full respiratory excursions Cardiovascular: normal position and quality of the apical impulse, regular rhythm, normal first and second heart sounds, no murmurs, rubs or gallops Abdomen: no tenderness or distention, no masses by palpation, no abnormal pulsatility or arterial bruits, normal bowel sounds, no hepatosplenomegaly Extremities: no clubbing, cyanosis; trivial ankle bilateral edema; 2+ radial, ulnar and brachial pulses bilaterally; 2+ right femoral, posterior tibial and dorsalis pedis pulses; 2+ left femoral, posterior tibial and dorsalis pedis pulses; no subclavian or femoral bruits Neurological: grossly nonfocal   EKG: Sinus rhythm normal tracing   ASSESSMENT AND PLAN NSVT (nonsustained ventricular tachycardia) Incidentally discovered on event monitor, asymptomatic, normal echo and nuclear stress test.  HTN (hypertension) Her blood pressure is high and this appears to be a consistent problem, even outside of the current illness. I have added hydrochlorothiazide 12.5 mg once daily. She has a followup appointment with her primary care physician in one month. That will be an optimal opportunity to reevaluate her blood pressure. Also discussed with her the importance of avoiding sodium rich foods such as a Campbell's chicken soup that she is currently eating. She should increase her intake of potassium rich foods to avoid hydrochlorothiazide-induced hypokalemia.  Ectopic atrial tachycardia No symptomatic events   Orders Placed This Encounter  Procedures  . EKG 12-Lead   Meds ordered this  encounter  Medications  . vitamin E 400 UNIT capsule    Sig: Take 400 Units by mouth daily.  . fish oil-omega-3 fatty acids 1000 MG capsule    Sig: Take 1 g by mouth daily.  Marland Kitchen b complex vitamins tablet    Sig: Take 1 tablet by mouth daily.  . calcium-vitamin D (OSCAL WITH D) 500-200 MG-UNIT per tablet    Sig: Take 1 tablet by mouth.  . Garlic (GARLIQUE PO)    Sig: Take by mouth.  Marland Kitchen acetaminophen (TYLENOL) 325 MG tablet    Sig: Take 650 mg by mouth every 6 (six) hours as needed for pain.  . hydrochlorothiazide (MICROZIDE) 12.5 MG capsule    Sig: Take 1 capsule (12.5 mg total) by mouth daily.    Dispense:  90 capsule    Refill:  3    Ramir Malerba  Thurmon Fair, MD, [facility] [phone] office [phone] pager

## 2013-06-02 DIAGNOSIS — E782 Mixed hyperlipidemia: Secondary | ICD-10-CM | POA: Diagnosis not present

## 2013-06-02 DIAGNOSIS — E119 Type 2 diabetes mellitus without complications: Secondary | ICD-10-CM | POA: Diagnosis not present

## 2013-06-02 DIAGNOSIS — I10 Essential (primary) hypertension: Secondary | ICD-10-CM | POA: Diagnosis not present

## 2013-06-05 DIAGNOSIS — M5137 Other intervertebral disc degeneration, lumbosacral region: Secondary | ICD-10-CM | POA: Diagnosis not present

## 2013-06-05 DIAGNOSIS — M9981 Other biomechanical lesions of cervical region: Secondary | ICD-10-CM | POA: Diagnosis not present

## 2013-06-05 DIAGNOSIS — M999 Biomechanical lesion, unspecified: Secondary | ICD-10-CM | POA: Diagnosis not present

## 2013-06-09 DIAGNOSIS — I10 Essential (primary) hypertension: Secondary | ICD-10-CM | POA: Diagnosis not present

## 2013-06-09 DIAGNOSIS — M159 Polyosteoarthritis, unspecified: Secondary | ICD-10-CM | POA: Diagnosis not present

## 2013-06-09 DIAGNOSIS — I052 Rheumatic mitral stenosis with insufficiency: Secondary | ICD-10-CM | POA: Diagnosis not present

## 2013-06-23 DIAGNOSIS — M5137 Other intervertebral disc degeneration, lumbosacral region: Secondary | ICD-10-CM | POA: Diagnosis not present

## 2013-06-23 DIAGNOSIS — M9981 Other biomechanical lesions of cervical region: Secondary | ICD-10-CM | POA: Diagnosis not present

## 2013-06-23 DIAGNOSIS — M999 Biomechanical lesion, unspecified: Secondary | ICD-10-CM | POA: Diagnosis not present

## 2013-07-01 DIAGNOSIS — Z961 Presence of intraocular lens: Secondary | ICD-10-CM | POA: Diagnosis not present

## 2013-07-01 DIAGNOSIS — H35379 Puckering of macula, unspecified eye: Secondary | ICD-10-CM | POA: Diagnosis not present

## 2013-07-22 DIAGNOSIS — Z23 Encounter for immunization: Secondary | ICD-10-CM | POA: Diagnosis not present

## 2013-07-30 DIAGNOSIS — M999 Biomechanical lesion, unspecified: Secondary | ICD-10-CM | POA: Diagnosis not present

## 2013-07-30 DIAGNOSIS — M9981 Other biomechanical lesions of cervical region: Secondary | ICD-10-CM | POA: Diagnosis not present

## 2013-07-30 DIAGNOSIS — M5137 Other intervertebral disc degeneration, lumbosacral region: Secondary | ICD-10-CM | POA: Diagnosis not present

## 2013-09-18 DIAGNOSIS — M9981 Other biomechanical lesions of cervical region: Secondary | ICD-10-CM | POA: Diagnosis not present

## 2013-09-18 DIAGNOSIS — M5137 Other intervertebral disc degeneration, lumbosacral region: Secondary | ICD-10-CM | POA: Diagnosis not present

## 2013-09-18 DIAGNOSIS — M999 Biomechanical lesion, unspecified: Secondary | ICD-10-CM | POA: Diagnosis not present

## 2013-10-23 DIAGNOSIS — M9981 Other biomechanical lesions of cervical region: Secondary | ICD-10-CM | POA: Diagnosis not present

## 2013-10-23 DIAGNOSIS — M999 Biomechanical lesion, unspecified: Secondary | ICD-10-CM | POA: Diagnosis not present

## 2013-10-23 DIAGNOSIS — M5137 Other intervertebral disc degeneration, lumbosacral region: Secondary | ICD-10-CM | POA: Diagnosis not present

## 2013-12-04 DIAGNOSIS — M9981 Other biomechanical lesions of cervical region: Secondary | ICD-10-CM | POA: Diagnosis not present

## 2013-12-04 DIAGNOSIS — M999 Biomechanical lesion, unspecified: Secondary | ICD-10-CM | POA: Diagnosis not present

## 2013-12-04 DIAGNOSIS — M5137 Other intervertebral disc degeneration, lumbosacral region: Secondary | ICD-10-CM | POA: Diagnosis not present

## 2013-12-14 DIAGNOSIS — N390 Urinary tract infection, site not specified: Secondary | ICD-10-CM | POA: Diagnosis not present

## 2013-12-14 DIAGNOSIS — Z1331 Encounter for screening for depression: Secondary | ICD-10-CM | POA: Diagnosis not present

## 2013-12-14 DIAGNOSIS — E782 Mixed hyperlipidemia: Secondary | ICD-10-CM | POA: Diagnosis not present

## 2013-12-14 DIAGNOSIS — E119 Type 2 diabetes mellitus without complications: Secondary | ICD-10-CM | POA: Diagnosis not present

## 2013-12-14 DIAGNOSIS — Z0000 Encounter for general adult medical examination without abnormal findings: Secondary | ICD-10-CM | POA: Diagnosis not present

## 2013-12-14 DIAGNOSIS — I10 Essential (primary) hypertension: Secondary | ICD-10-CM | POA: Diagnosis not present

## 2013-12-28 DIAGNOSIS — M159 Polyosteoarthritis, unspecified: Secondary | ICD-10-CM | POA: Diagnosis not present

## 2013-12-28 DIAGNOSIS — N393 Stress incontinence (female) (male): Secondary | ICD-10-CM | POA: Diagnosis not present

## 2013-12-28 DIAGNOSIS — E782 Mixed hyperlipidemia: Secondary | ICD-10-CM | POA: Diagnosis not present

## 2013-12-28 DIAGNOSIS — I10 Essential (primary) hypertension: Secondary | ICD-10-CM | POA: Diagnosis not present

## 2013-12-28 DIAGNOSIS — IMO0001 Reserved for inherently not codable concepts without codable children: Secondary | ICD-10-CM | POA: Diagnosis not present

## 2014-02-10 DIAGNOSIS — M9981 Other biomechanical lesions of cervical region: Secondary | ICD-10-CM | POA: Diagnosis not present

## 2014-02-10 DIAGNOSIS — M5137 Other intervertebral disc degeneration, lumbosacral region: Secondary | ICD-10-CM | POA: Diagnosis not present

## 2014-02-10 DIAGNOSIS — M999 Biomechanical lesion, unspecified: Secondary | ICD-10-CM | POA: Diagnosis not present

## 2014-03-01 DIAGNOSIS — E782 Mixed hyperlipidemia: Secondary | ICD-10-CM | POA: Diagnosis not present

## 2014-03-08 DIAGNOSIS — I10 Essential (primary) hypertension: Secondary | ICD-10-CM | POA: Diagnosis not present

## 2014-03-08 DIAGNOSIS — E782 Mixed hyperlipidemia: Secondary | ICD-10-CM | POA: Diagnosis not present

## 2014-03-08 DIAGNOSIS — N390 Urinary tract infection, site not specified: Secondary | ICD-10-CM | POA: Diagnosis not present

## 2014-03-08 DIAGNOSIS — IMO0001 Reserved for inherently not codable concepts without codable children: Secondary | ICD-10-CM | POA: Diagnosis not present

## 2014-03-09 DIAGNOSIS — N390 Urinary tract infection, site not specified: Secondary | ICD-10-CM | POA: Diagnosis not present

## 2014-04-19 DIAGNOSIS — M9981 Other biomechanical lesions of cervical region: Secondary | ICD-10-CM | POA: Diagnosis not present

## 2014-04-19 DIAGNOSIS — M999 Biomechanical lesion, unspecified: Secondary | ICD-10-CM | POA: Diagnosis not present

## 2014-04-19 DIAGNOSIS — M5137 Other intervertebral disc degeneration, lumbosacral region: Secondary | ICD-10-CM | POA: Diagnosis not present

## 2014-05-11 DIAGNOSIS — E782 Mixed hyperlipidemia: Secondary | ICD-10-CM | POA: Diagnosis not present

## 2014-05-11 DIAGNOSIS — E119 Type 2 diabetes mellitus without complications: Secondary | ICD-10-CM | POA: Diagnosis not present

## 2014-05-11 DIAGNOSIS — I10 Essential (primary) hypertension: Secondary | ICD-10-CM | POA: Diagnosis not present

## 2014-05-11 DIAGNOSIS — N390 Urinary tract infection, site not specified: Secondary | ICD-10-CM | POA: Diagnosis not present

## 2014-05-17 DIAGNOSIS — E782 Mixed hyperlipidemia: Secondary | ICD-10-CM | POA: Diagnosis not present

## 2014-05-17 DIAGNOSIS — I10 Essential (primary) hypertension: Secondary | ICD-10-CM | POA: Diagnosis not present

## 2014-05-17 DIAGNOSIS — N318 Other neuromuscular dysfunction of bladder: Secondary | ICD-10-CM | POA: Diagnosis not present

## 2014-05-17 DIAGNOSIS — IMO0001 Reserved for inherently not codable concepts without codable children: Secondary | ICD-10-CM | POA: Diagnosis not present

## 2014-05-20 ENCOUNTER — Encounter: Payer: Self-pay | Admitting: Cardiovascular Disease

## 2014-05-20 ENCOUNTER — Ambulatory Visit (INDEPENDENT_AMBULATORY_CARE_PROVIDER_SITE_OTHER): Payer: Medicare Other | Admitting: Cardiovascular Disease

## 2014-05-20 VITALS — BP 132/74 | HR 92 | Resp 16 | Ht 62.0 in | Wt 215.0 lb

## 2014-05-20 DIAGNOSIS — I4729 Other ventricular tachycardia: Secondary | ICD-10-CM

## 2014-05-20 DIAGNOSIS — I472 Ventricular tachycardia: Secondary | ICD-10-CM

## 2014-05-20 NOTE — Patient Instructions (Signed)
Your physician wants you to follow-up in: ONE YEAR WITH DR CROITORU You will receive a reminder letter in the mail two months in advance. If you don't receive a letter, please call our office to schedule the follow-up appointment.  

## 2014-05-20 NOTE — Progress Notes (Signed)
Patient ID: Lynn Hardy, female   DOB: [birth year], 80 y.o.   MRN: [id number]      Reason for office visit NSVT, PAT, HTN, presyncope  Lynn Hardy had an episode of sudden severe weakness and presyncope that occurred roughly one year ago. At that time she had a normal echo and normal nuclear stress test. A 30 day event monitor shows bursts of ectopic atrial tachycardia as well as a few episodes of nonsustained ventricular tachycardia (the longest consisting of only about 10 beats). None of the arrhythmic events was associated with symptoms while wearing the monitor.  At that time we switched her antihypertensive medication to a beta blocker. She has not had any new events and feels well. Rarely andd inconsistently has mild dyspnea at rest.   No Known Allergies  Current Outpatient Prescriptions  Medication Sig Dispense Refill  . acetaminophen (TYLENOL) 325 MG tablet Take 650 mg by mouth every 6 (six) hours as needed for pain.      Marland Kitchen b complex vitamins tablet Take 1 tablet by mouth daily.      . calcium-vitamin D (OSCAL WITH D) 500-200 MG-UNIT per tablet Take 1 tablet by mouth.      . carvedilol (COREG) 6.25 MG tablet take 1 tablet by mouth twice a day  60 tablet  6  . fish oil-omega-3 fatty acids 1000 MG capsule Take 1 g by mouth daily.      . Garlic (GARLIQUE PO) Take by mouth.      . hydrochlorothiazide (MICROZIDE) 12.5 MG capsule Take 1 capsule (12.5 mg total) by mouth daily.  90 capsule  3  . vitamin E 400 UNIT capsule Take 400 Units by mouth daily.       No current facility-administered medications for this visit.    Past Medical History  Diagnosis Date  . PAT (paroxysmal atrial tachycardia)   . NSVT (nonsustained ventricular tachycardia)     No past surgical history on file.  Family History  Problem Relation Age of Onset  . Tuberculosis Father 50    History   Social History  . Marital Status: Divorced    Spouse Name: N/A    Number of Children: N/A  .  Years of Education: N/A   Occupational History  . Not on file.   Social History Main Topics  . Smoking status: Former Smoker    Quit date: 01/15/1998  . Smokeless tobacco: Not on file  . Alcohol Use: No  . Drug Use: No  . Sexual Activity: Not on file   Other Topics Concern  . Not on file   Social History Narrative  . No narrative on file    Review of systems: The patient specifically denies any chest pain at rest or with exertion, dyspnea with exertion, orthopnea, paroxysmal nocturnal dyspnea, syncope, palpitations, focal neurological deficits, intermittent claudication, lower extremity edema, unexplained weight gain, cough, hemoptysis or wheezing.  The patient also denies abdominal pain, nausea, vomiting, dysphagia, diarrhea, constipation, polyuria, polydipsia, dysuria, hematuria, frequency, urgency, abnormal bleeding or bruising, fever, chills, unexpected weight changes, mood swings, change in skin or hair texture, change in voice quality, auditory or visual problems, allergic reactions or rashes, new musculoskeletal complaints other than usual "aches and pains".   PHYSICAL EXAM BP 132/74  Pulse 92  Resp 16  Ht 5\' 2"  (1.575 m)  Wt 215 lb (97.523 kg)  BMI 39.31 kg/m2  General: Alert, oriented x3, no distress Head: no evidence of trauma, PERRL, EOMI, no exophtalmos or lid  lag, no myxedema, no xanthelasma; normal ears, nose and oropharynx Neck: normal jugular venous pulsations and no hepatojugular reflux; brisk carotid pulses without delay and no carotid bruits Chest: clear to auscultation, no signs of consolidation by percussion or palpation, normal fremitus, symmetrical and full respiratory excursions Cardiovascular: normal position and quality of the apical impulse, regular rhythm, normal first and second heart sounds, no murmurs, rubs or gallops Abdomen: no tenderness or distention, no masses by palpation, no abnormal pulsatility or arterial bruits, normal bowel sounds, no  hepatosplenomegaly Extremities: no clubbing, cyanosis or edema; 2+ radial, ulnar and brachial pulses bilaterally; 2+ right femoral, posterior tibial and dorsalis pedis pulses; 2+ left femoral, posterior tibial and dorsalis pedis pulses; no subclavian or femoral bruits Neurological: grossly nonfocal   EKG: NSR, QS V1-V2 (old)  ASSESSMENT AND PLAN  NSVT (nonsustained ventricular tachycardia)  Incidentally discovered on event monitor, asymptomatic, normal echo and nuclear stress test. Unlikely of clinical significance.  HTN (hypertension)  Her blood pressure is now well controled   Ectopic atrial tachycardia  No symptomatic events  Patient Instructions  Your physician wants you to follow-up in: [address].  You will receive a reminder letter in the mail two months in advance. If you don't receive a letter, please call our office to schedule the follow-up appointment.     Holli Humbles, MD, [place] [phone] office [phone] pager

## 2014-05-26 ENCOUNTER — Telehealth: Payer: Self-pay | Admitting: Cardiovascular Disease

## 2014-05-26 MED ORDER — CARVEDILOL 6.25 MG PO TABS: 6.2500 mg | ORAL_TABLET | Freq: Two times a day (BID) | ORAL | Status: DC

## 2014-05-26 NOTE — Telephone Encounter (Signed)
Pt wants to know if Dr C wants her to renew her [place]? She forgot to ask him about thist last week. If he does,please call to [place].

## 2014-05-26 NOTE — Telephone Encounter (Signed)
Per last OV - continue current medications.  Refill sent to pharmacy. Unable to leave message on cell # and home # has been disconnected.

## 2014-06-17 DIAGNOSIS — M5031 Other cervical disc degeneration,  high cervical region: Secondary | ICD-10-CM | POA: Diagnosis not present

## 2014-06-17 DIAGNOSIS — M9901 Segmental and somatic dysfunction of cervical region: Secondary | ICD-10-CM | POA: Diagnosis not present

## 2014-06-17 DIAGNOSIS — M9902 Segmental and somatic dysfunction of thoracic region: Secondary | ICD-10-CM | POA: Diagnosis not present

## 2014-06-17 DIAGNOSIS — M62838 Other muscle spasm: Secondary | ICD-10-CM | POA: Diagnosis not present

## 2014-06-30 DIAGNOSIS — Z23 Encounter for immunization: Secondary | ICD-10-CM | POA: Diagnosis not present

## 2014-09-01 DIAGNOSIS — M9902 Segmental and somatic dysfunction of thoracic region: Secondary | ICD-10-CM | POA: Diagnosis not present

## 2014-09-01 DIAGNOSIS — M5031 Other cervical disc degeneration,  high cervical region: Secondary | ICD-10-CM | POA: Diagnosis not present

## 2014-09-01 DIAGNOSIS — M9901 Segmental and somatic dysfunction of cervical region: Secondary | ICD-10-CM | POA: Diagnosis not present

## 2014-09-01 DIAGNOSIS — M62838 Other muscle spasm: Secondary | ICD-10-CM | POA: Diagnosis not present

## 2014-09-27 DIAGNOSIS — E782 Mixed hyperlipidemia: Secondary | ICD-10-CM | POA: Diagnosis not present

## 2014-09-27 DIAGNOSIS — E1165 Type 2 diabetes mellitus with hyperglycemia: Secondary | ICD-10-CM | POA: Diagnosis not present

## 2014-09-27 DIAGNOSIS — I10 Essential (primary) hypertension: Secondary | ICD-10-CM | POA: Diagnosis not present

## 2014-10-04 DIAGNOSIS — E1165 Type 2 diabetes mellitus with hyperglycemia: Secondary | ICD-10-CM | POA: Diagnosis not present

## 2014-10-04 DIAGNOSIS — I10 Essential (primary) hypertension: Secondary | ICD-10-CM | POA: Diagnosis not present

## 2014-10-04 DIAGNOSIS — E782 Mixed hyperlipidemia: Secondary | ICD-10-CM | POA: Diagnosis not present

## 2014-11-22 DIAGNOSIS — M5136 Other intervertebral disc degeneration, lumbar region: Secondary | ICD-10-CM | POA: Diagnosis not present

## 2014-11-22 DIAGNOSIS — M9905 Segmental and somatic dysfunction of pelvic region: Secondary | ICD-10-CM | POA: Diagnosis not present

## 2014-11-22 DIAGNOSIS — M9904 Segmental and somatic dysfunction of sacral region: Secondary | ICD-10-CM | POA: Diagnosis not present

## 2014-11-22 DIAGNOSIS — M9903 Segmental and somatic dysfunction of lumbar region: Secondary | ICD-10-CM | POA: Diagnosis not present

## 2014-11-24 DIAGNOSIS — Z961 Presence of intraocular lens: Secondary | ICD-10-CM | POA: Diagnosis not present

## 2014-11-24 DIAGNOSIS — H35371 Puckering of macula, right eye: Secondary | ICD-10-CM | POA: Diagnosis not present

## 2014-11-30 DIAGNOSIS — M5136 Other intervertebral disc degeneration, lumbar region: Secondary | ICD-10-CM | POA: Diagnosis not present

## 2014-11-30 DIAGNOSIS — M9905 Segmental and somatic dysfunction of pelvic region: Secondary | ICD-10-CM | POA: Diagnosis not present

## 2014-11-30 DIAGNOSIS — M9903 Segmental and somatic dysfunction of lumbar region: Secondary | ICD-10-CM | POA: Diagnosis not present

## 2014-11-30 DIAGNOSIS — M9904 Segmental and somatic dysfunction of sacral region: Secondary | ICD-10-CM | POA: Diagnosis not present

## 2014-12-27 DIAGNOSIS — M9905 Segmental and somatic dysfunction of pelvic region: Secondary | ICD-10-CM | POA: Diagnosis not present

## 2014-12-27 DIAGNOSIS — M5136 Other intervertebral disc degeneration, lumbar region: Secondary | ICD-10-CM | POA: Diagnosis not present

## 2014-12-27 DIAGNOSIS — M9903 Segmental and somatic dysfunction of lumbar region: Secondary | ICD-10-CM | POA: Diagnosis not present

## 2014-12-27 DIAGNOSIS — M9904 Segmental and somatic dysfunction of sacral region: Secondary | ICD-10-CM | POA: Diagnosis not present

## 2015-01-18 DIAGNOSIS — I10 Essential (primary) hypertension: Secondary | ICD-10-CM | POA: Diagnosis not present

## 2015-01-18 DIAGNOSIS — E1165 Type 2 diabetes mellitus with hyperglycemia: Secondary | ICD-10-CM | POA: Diagnosis not present

## 2015-01-18 DIAGNOSIS — E782 Mixed hyperlipidemia: Secondary | ICD-10-CM | POA: Diagnosis not present

## 2015-01-18 DIAGNOSIS — Z1389 Encounter for screening for other disorder: Secondary | ICD-10-CM | POA: Diagnosis not present

## 2015-01-18 DIAGNOSIS — N390 Urinary tract infection, site not specified: Secondary | ICD-10-CM | POA: Diagnosis not present

## 2015-01-18 DIAGNOSIS — Z0000 Encounter for general adult medical examination without abnormal findings: Secondary | ICD-10-CM | POA: Diagnosis not present

## 2015-01-25 DIAGNOSIS — I052 Rheumatic mitral stenosis with insufficiency: Secondary | ICD-10-CM | POA: Diagnosis not present

## 2015-01-25 DIAGNOSIS — E1165 Type 2 diabetes mellitus with hyperglycemia: Secondary | ICD-10-CM | POA: Diagnosis not present

## 2015-01-25 DIAGNOSIS — I10 Essential (primary) hypertension: Secondary | ICD-10-CM | POA: Diagnosis not present

## 2015-01-25 DIAGNOSIS — Z780 Asymptomatic menopausal state: Secondary | ICD-10-CM | POA: Diagnosis not present

## 2015-01-25 DIAGNOSIS — E782 Mixed hyperlipidemia: Secondary | ICD-10-CM | POA: Diagnosis not present

## 2015-02-08 DIAGNOSIS — M9904 Segmental and somatic dysfunction of sacral region: Secondary | ICD-10-CM | POA: Diagnosis not present

## 2015-02-08 DIAGNOSIS — M9903 Segmental and somatic dysfunction of lumbar region: Secondary | ICD-10-CM | POA: Diagnosis not present

## 2015-02-08 DIAGNOSIS — M9905 Segmental and somatic dysfunction of pelvic region: Secondary | ICD-10-CM | POA: Diagnosis not present

## 2015-02-08 DIAGNOSIS — M5136 Other intervertebral disc degeneration, lumbar region: Secondary | ICD-10-CM | POA: Diagnosis not present

## 2015-02-16 DIAGNOSIS — M5136 Other intervertebral disc degeneration, lumbar region: Secondary | ICD-10-CM | POA: Diagnosis not present

## 2015-02-16 DIAGNOSIS — M9905 Segmental and somatic dysfunction of pelvic region: Secondary | ICD-10-CM | POA: Diagnosis not present

## 2015-02-16 DIAGNOSIS — M9903 Segmental and somatic dysfunction of lumbar region: Secondary | ICD-10-CM | POA: Diagnosis not present

## 2015-02-16 DIAGNOSIS — M9904 Segmental and somatic dysfunction of sacral region: Secondary | ICD-10-CM | POA: Diagnosis not present

## 2015-02-17 DIAGNOSIS — M9904 Segmental and somatic dysfunction of sacral region: Secondary | ICD-10-CM | POA: Diagnosis not present

## 2015-02-17 DIAGNOSIS — M9903 Segmental and somatic dysfunction of lumbar region: Secondary | ICD-10-CM | POA: Diagnosis not present

## 2015-02-17 DIAGNOSIS — M5136 Other intervertebral disc degeneration, lumbar region: Secondary | ICD-10-CM | POA: Diagnosis not present

## 2015-02-17 DIAGNOSIS — M9905 Segmental and somatic dysfunction of pelvic region: Secondary | ICD-10-CM | POA: Diagnosis not present

## 2015-02-18 DIAGNOSIS — M9904 Segmental and somatic dysfunction of sacral region: Secondary | ICD-10-CM | POA: Diagnosis not present

## 2015-02-18 DIAGNOSIS — M5136 Other intervertebral disc degeneration, lumbar region: Secondary | ICD-10-CM | POA: Diagnosis not present

## 2015-02-18 DIAGNOSIS — M9905 Segmental and somatic dysfunction of pelvic region: Secondary | ICD-10-CM | POA: Diagnosis not present

## 2015-02-18 DIAGNOSIS — M9903 Segmental and somatic dysfunction of lumbar region: Secondary | ICD-10-CM | POA: Diagnosis not present

## 2015-02-21 DIAGNOSIS — M9903 Segmental and somatic dysfunction of lumbar region: Secondary | ICD-10-CM | POA: Diagnosis not present

## 2015-02-21 DIAGNOSIS — M9905 Segmental and somatic dysfunction of pelvic region: Secondary | ICD-10-CM | POA: Diagnosis not present

## 2015-02-21 DIAGNOSIS — M5136 Other intervertebral disc degeneration, lumbar region: Secondary | ICD-10-CM | POA: Diagnosis not present

## 2015-02-21 DIAGNOSIS — M9904 Segmental and somatic dysfunction of sacral region: Secondary | ICD-10-CM | POA: Diagnosis not present

## 2015-02-23 DIAGNOSIS — M9904 Segmental and somatic dysfunction of sacral region: Secondary | ICD-10-CM | POA: Diagnosis not present

## 2015-02-23 DIAGNOSIS — M9905 Segmental and somatic dysfunction of pelvic region: Secondary | ICD-10-CM | POA: Diagnosis not present

## 2015-02-23 DIAGNOSIS — M9903 Segmental and somatic dysfunction of lumbar region: Secondary | ICD-10-CM | POA: Diagnosis not present

## 2015-02-23 DIAGNOSIS — M5136 Other intervertebral disc degeneration, lumbar region: Secondary | ICD-10-CM | POA: Diagnosis not present

## 2015-02-25 DIAGNOSIS — M5136 Other intervertebral disc degeneration, lumbar region: Secondary | ICD-10-CM | POA: Diagnosis not present

## 2015-02-25 DIAGNOSIS — M9904 Segmental and somatic dysfunction of sacral region: Secondary | ICD-10-CM | POA: Diagnosis not present

## 2015-02-25 DIAGNOSIS — M9903 Segmental and somatic dysfunction of lumbar region: Secondary | ICD-10-CM | POA: Diagnosis not present

## 2015-02-25 DIAGNOSIS — M9905 Segmental and somatic dysfunction of pelvic region: Secondary | ICD-10-CM | POA: Diagnosis not present

## 2015-03-02 DIAGNOSIS — M9904 Segmental and somatic dysfunction of sacral region: Secondary | ICD-10-CM | POA: Diagnosis not present

## 2015-03-02 DIAGNOSIS — M9903 Segmental and somatic dysfunction of lumbar region: Secondary | ICD-10-CM | POA: Diagnosis not present

## 2015-03-02 DIAGNOSIS — M5136 Other intervertebral disc degeneration, lumbar region: Secondary | ICD-10-CM | POA: Diagnosis not present

## 2015-03-02 DIAGNOSIS — M9905 Segmental and somatic dysfunction of pelvic region: Secondary | ICD-10-CM | POA: Diagnosis not present

## 2015-03-04 DIAGNOSIS — M9904 Segmental and somatic dysfunction of sacral region: Secondary | ICD-10-CM | POA: Diagnosis not present

## 2015-03-04 DIAGNOSIS — M9903 Segmental and somatic dysfunction of lumbar region: Secondary | ICD-10-CM | POA: Diagnosis not present

## 2015-03-04 DIAGNOSIS — M9905 Segmental and somatic dysfunction of pelvic region: Secondary | ICD-10-CM | POA: Diagnosis not present

## 2015-03-04 DIAGNOSIS — M5136 Other intervertebral disc degeneration, lumbar region: Secondary | ICD-10-CM | POA: Diagnosis not present

## 2015-03-08 DIAGNOSIS — M9905 Segmental and somatic dysfunction of pelvic region: Secondary | ICD-10-CM | POA: Diagnosis not present

## 2015-03-08 DIAGNOSIS — M9903 Segmental and somatic dysfunction of lumbar region: Secondary | ICD-10-CM | POA: Diagnosis not present

## 2015-03-08 DIAGNOSIS — M5136 Other intervertebral disc degeneration, lumbar region: Secondary | ICD-10-CM | POA: Diagnosis not present

## 2015-03-08 DIAGNOSIS — M9904 Segmental and somatic dysfunction of sacral region: Secondary | ICD-10-CM | POA: Diagnosis not present

## 2015-03-10 DIAGNOSIS — M5136 Other intervertebral disc degeneration, lumbar region: Secondary | ICD-10-CM | POA: Diagnosis not present

## 2015-03-10 DIAGNOSIS — M9904 Segmental and somatic dysfunction of sacral region: Secondary | ICD-10-CM | POA: Diagnosis not present

## 2015-03-10 DIAGNOSIS — M9903 Segmental and somatic dysfunction of lumbar region: Secondary | ICD-10-CM | POA: Diagnosis not present

## 2015-03-10 DIAGNOSIS — M9905 Segmental and somatic dysfunction of pelvic region: Secondary | ICD-10-CM | POA: Diagnosis not present

## 2015-03-15 DIAGNOSIS — M5136 Other intervertebral disc degeneration, lumbar region: Secondary | ICD-10-CM | POA: Diagnosis not present

## 2015-03-15 DIAGNOSIS — M9905 Segmental and somatic dysfunction of pelvic region: Secondary | ICD-10-CM | POA: Diagnosis not present

## 2015-03-15 DIAGNOSIS — M9904 Segmental and somatic dysfunction of sacral region: Secondary | ICD-10-CM | POA: Diagnosis not present

## 2015-03-15 DIAGNOSIS — M9903 Segmental and somatic dysfunction of lumbar region: Secondary | ICD-10-CM | POA: Diagnosis not present

## 2015-03-17 DIAGNOSIS — M5136 Other intervertebral disc degeneration, lumbar region: Secondary | ICD-10-CM | POA: Diagnosis not present

## 2015-03-17 DIAGNOSIS — M9904 Segmental and somatic dysfunction of sacral region: Secondary | ICD-10-CM | POA: Diagnosis not present

## 2015-03-17 DIAGNOSIS — M9903 Segmental and somatic dysfunction of lumbar region: Secondary | ICD-10-CM | POA: Diagnosis not present

## 2015-03-17 DIAGNOSIS — M9905 Segmental and somatic dysfunction of pelvic region: Secondary | ICD-10-CM | POA: Diagnosis not present

## 2015-03-23 DIAGNOSIS — M9903 Segmental and somatic dysfunction of lumbar region: Secondary | ICD-10-CM | POA: Diagnosis not present

## 2015-03-23 DIAGNOSIS — M9905 Segmental and somatic dysfunction of pelvic region: Secondary | ICD-10-CM | POA: Diagnosis not present

## 2015-03-23 DIAGNOSIS — M5136 Other intervertebral disc degeneration, lumbar region: Secondary | ICD-10-CM | POA: Diagnosis not present

## 2015-03-23 DIAGNOSIS — M9904 Segmental and somatic dysfunction of sacral region: Secondary | ICD-10-CM | POA: Diagnosis not present

## 2015-04-05 DIAGNOSIS — M9904 Segmental and somatic dysfunction of sacral region: Secondary | ICD-10-CM | POA: Diagnosis not present

## 2015-04-05 DIAGNOSIS — M5136 Other intervertebral disc degeneration, lumbar region: Secondary | ICD-10-CM | POA: Diagnosis not present

## 2015-04-05 DIAGNOSIS — M9903 Segmental and somatic dysfunction of lumbar region: Secondary | ICD-10-CM | POA: Diagnosis not present

## 2015-04-05 DIAGNOSIS — M9905 Segmental and somatic dysfunction of pelvic region: Secondary | ICD-10-CM | POA: Diagnosis not present

## 2015-04-15 DIAGNOSIS — M5136 Other intervertebral disc degeneration, lumbar region: Secondary | ICD-10-CM | POA: Diagnosis not present

## 2015-04-15 DIAGNOSIS — M9904 Segmental and somatic dysfunction of sacral region: Secondary | ICD-10-CM | POA: Diagnosis not present

## 2015-04-15 DIAGNOSIS — M9903 Segmental and somatic dysfunction of lumbar region: Secondary | ICD-10-CM | POA: Diagnosis not present

## 2015-04-15 DIAGNOSIS — M9905 Segmental and somatic dysfunction of pelvic region: Secondary | ICD-10-CM | POA: Diagnosis not present

## 2015-04-20 DIAGNOSIS — M9904 Segmental and somatic dysfunction of sacral region: Secondary | ICD-10-CM | POA: Diagnosis not present

## 2015-04-20 DIAGNOSIS — M5136 Other intervertebral disc degeneration, lumbar region: Secondary | ICD-10-CM | POA: Diagnosis not present

## 2015-04-20 DIAGNOSIS — M9903 Segmental and somatic dysfunction of lumbar region: Secondary | ICD-10-CM | POA: Diagnosis not present

## 2015-04-20 DIAGNOSIS — M9905 Segmental and somatic dysfunction of pelvic region: Secondary | ICD-10-CM | POA: Diagnosis not present

## 2015-05-24 DIAGNOSIS — M5136 Other intervertebral disc degeneration, lumbar region: Secondary | ICD-10-CM | POA: Diagnosis not present

## 2015-05-24 DIAGNOSIS — M9903 Segmental and somatic dysfunction of lumbar region: Secondary | ICD-10-CM | POA: Diagnosis not present

## 2015-05-24 DIAGNOSIS — M9905 Segmental and somatic dysfunction of pelvic region: Secondary | ICD-10-CM | POA: Diagnosis not present

## 2015-05-24 DIAGNOSIS — M9904 Segmental and somatic dysfunction of sacral region: Secondary | ICD-10-CM | POA: Diagnosis not present

## 2015-06-02 DIAGNOSIS — M9904 Segmental and somatic dysfunction of sacral region: Secondary | ICD-10-CM | POA: Diagnosis not present

## 2015-06-02 DIAGNOSIS — M9905 Segmental and somatic dysfunction of pelvic region: Secondary | ICD-10-CM | POA: Diagnosis not present

## 2015-06-02 DIAGNOSIS — M5136 Other intervertebral disc degeneration, lumbar region: Secondary | ICD-10-CM | POA: Diagnosis not present

## 2015-06-02 DIAGNOSIS — M9903 Segmental and somatic dysfunction of lumbar region: Secondary | ICD-10-CM | POA: Diagnosis not present

## 2015-06-14 DIAGNOSIS — M9905 Segmental and somatic dysfunction of pelvic region: Secondary | ICD-10-CM | POA: Diagnosis not present

## 2015-06-14 DIAGNOSIS — M5136 Other intervertebral disc degeneration, lumbar region: Secondary | ICD-10-CM | POA: Diagnosis not present

## 2015-06-14 DIAGNOSIS — M9903 Segmental and somatic dysfunction of lumbar region: Secondary | ICD-10-CM | POA: Diagnosis not present

## 2015-06-14 DIAGNOSIS — M9904 Segmental and somatic dysfunction of sacral region: Secondary | ICD-10-CM | POA: Diagnosis not present

## 2015-06-27 DIAGNOSIS — E782 Mixed hyperlipidemia: Secondary | ICD-10-CM | POA: Diagnosis not present

## 2015-06-27 DIAGNOSIS — I10 Essential (primary) hypertension: Secondary | ICD-10-CM | POA: Diagnosis not present

## 2015-06-27 DIAGNOSIS — E1165 Type 2 diabetes mellitus with hyperglycemia: Secondary | ICD-10-CM | POA: Diagnosis not present

## 2015-07-04 DIAGNOSIS — E782 Mixed hyperlipidemia: Secondary | ICD-10-CM | POA: Diagnosis not present

## 2015-07-04 DIAGNOSIS — E1165 Type 2 diabetes mellitus with hyperglycemia: Secondary | ICD-10-CM | POA: Diagnosis not present

## 2015-07-04 DIAGNOSIS — Z23 Encounter for immunization: Secondary | ICD-10-CM | POA: Diagnosis not present

## 2015-07-04 DIAGNOSIS — I10 Essential (primary) hypertension: Secondary | ICD-10-CM | POA: Diagnosis not present

## 2015-07-04 DIAGNOSIS — I052 Rheumatic mitral stenosis with insufficiency: Secondary | ICD-10-CM | POA: Diagnosis not present

## 2015-07-21 DIAGNOSIS — M5136 Other intervertebral disc degeneration, lumbar region: Secondary | ICD-10-CM | POA: Diagnosis not present

## 2015-07-21 DIAGNOSIS — M9905 Segmental and somatic dysfunction of pelvic region: Secondary | ICD-10-CM | POA: Diagnosis not present

## 2015-07-21 DIAGNOSIS — M9903 Segmental and somatic dysfunction of lumbar region: Secondary | ICD-10-CM | POA: Diagnosis not present

## 2015-07-21 DIAGNOSIS — M9904 Segmental and somatic dysfunction of sacral region: Secondary | ICD-10-CM | POA: Diagnosis not present

## 2015-09-05 DIAGNOSIS — M9905 Segmental and somatic dysfunction of pelvic region: Secondary | ICD-10-CM | POA: Diagnosis not present

## 2015-09-05 DIAGNOSIS — M9904 Segmental and somatic dysfunction of sacral region: Secondary | ICD-10-CM | POA: Diagnosis not present

## 2015-09-05 DIAGNOSIS — M9903 Segmental and somatic dysfunction of lumbar region: Secondary | ICD-10-CM | POA: Diagnosis not present

## 2015-09-05 DIAGNOSIS — M5136 Other intervertebral disc degeneration, lumbar region: Secondary | ICD-10-CM | POA: Diagnosis not present

## 2015-09-07 DIAGNOSIS — M5136 Other intervertebral disc degeneration, lumbar region: Secondary | ICD-10-CM | POA: Diagnosis not present

## 2015-09-07 DIAGNOSIS — M9904 Segmental and somatic dysfunction of sacral region: Secondary | ICD-10-CM | POA: Diagnosis not present

## 2015-09-07 DIAGNOSIS — M9903 Segmental and somatic dysfunction of lumbar region: Secondary | ICD-10-CM | POA: Diagnosis not present

## 2015-09-07 DIAGNOSIS — M9905 Segmental and somatic dysfunction of pelvic region: Secondary | ICD-10-CM | POA: Diagnosis not present

## 2015-09-13 DIAGNOSIS — M9903 Segmental and somatic dysfunction of lumbar region: Secondary | ICD-10-CM | POA: Diagnosis not present

## 2015-09-13 DIAGNOSIS — M9905 Segmental and somatic dysfunction of pelvic region: Secondary | ICD-10-CM | POA: Diagnosis not present

## 2015-09-13 DIAGNOSIS — M9904 Segmental and somatic dysfunction of sacral region: Secondary | ICD-10-CM | POA: Diagnosis not present

## 2015-09-13 DIAGNOSIS — M5136 Other intervertebral disc degeneration, lumbar region: Secondary | ICD-10-CM | POA: Diagnosis not present

## 2015-10-10 DIAGNOSIS — M5136 Other intervertebral disc degeneration, lumbar region: Secondary | ICD-10-CM | POA: Diagnosis not present

## 2015-10-10 DIAGNOSIS — M9905 Segmental and somatic dysfunction of pelvic region: Secondary | ICD-10-CM | POA: Diagnosis not present

## 2015-10-10 DIAGNOSIS — M9903 Segmental and somatic dysfunction of lumbar region: Secondary | ICD-10-CM | POA: Diagnosis not present

## 2015-10-10 DIAGNOSIS — M9904 Segmental and somatic dysfunction of sacral region: Secondary | ICD-10-CM | POA: Diagnosis not present

## 2015-10-31 DIAGNOSIS — M9901 Segmental and somatic dysfunction of cervical region: Secondary | ICD-10-CM | POA: Diagnosis not present

## 2015-10-31 DIAGNOSIS — I10 Essential (primary) hypertension: Secondary | ICD-10-CM | POA: Diagnosis not present

## 2015-10-31 DIAGNOSIS — M9902 Segmental and somatic dysfunction of thoracic region: Secondary | ICD-10-CM | POA: Diagnosis not present

## 2015-10-31 DIAGNOSIS — E1165 Type 2 diabetes mellitus with hyperglycemia: Secondary | ICD-10-CM | POA: Diagnosis not present

## 2015-10-31 DIAGNOSIS — M5136 Other intervertebral disc degeneration, lumbar region: Secondary | ICD-10-CM | POA: Diagnosis not present

## 2015-10-31 DIAGNOSIS — M9903 Segmental and somatic dysfunction of lumbar region: Secondary | ICD-10-CM | POA: Diagnosis not present

## 2015-10-31 DIAGNOSIS — E782 Mixed hyperlipidemia: Secondary | ICD-10-CM | POA: Diagnosis not present

## 2015-11-03 DIAGNOSIS — M9903 Segmental and somatic dysfunction of lumbar region: Secondary | ICD-10-CM | POA: Diagnosis not present

## 2015-11-03 DIAGNOSIS — M5136 Other intervertebral disc degeneration, lumbar region: Secondary | ICD-10-CM | POA: Diagnosis not present

## 2015-11-03 DIAGNOSIS — M9902 Segmental and somatic dysfunction of thoracic region: Secondary | ICD-10-CM | POA: Diagnosis not present

## 2015-11-03 DIAGNOSIS — M9901 Segmental and somatic dysfunction of cervical region: Secondary | ICD-10-CM | POA: Diagnosis not present

## 2015-11-07 DIAGNOSIS — I10 Essential (primary) hypertension: Secondary | ICD-10-CM | POA: Diagnosis not present

## 2015-11-07 DIAGNOSIS — E782 Mixed hyperlipidemia: Secondary | ICD-10-CM | POA: Diagnosis not present

## 2015-11-07 DIAGNOSIS — E1165 Type 2 diabetes mellitus with hyperglycemia: Secondary | ICD-10-CM | POA: Diagnosis not present

## 2016-02-10 DIAGNOSIS — Z961 Presence of intraocular lens: Secondary | ICD-10-CM | POA: Diagnosis not present

## 2016-02-10 DIAGNOSIS — H35341 Macular cyst, hole, or pseudohole, right eye: Secondary | ICD-10-CM | POA: Diagnosis not present

## 2016-02-10 DIAGNOSIS — H43813 Vitreous degeneration, bilateral: Secondary | ICD-10-CM | POA: Diagnosis not present

## 2016-02-10 DIAGNOSIS — H52203 Unspecified astigmatism, bilateral: Secondary | ICD-10-CM | POA: Diagnosis not present

## 2016-03-01 DIAGNOSIS — M8580 Other specified disorders of bone density and structure, unspecified site: Secondary | ICD-10-CM | POA: Diagnosis not present

## 2016-03-01 DIAGNOSIS — Z780 Asymptomatic menopausal state: Secondary | ICD-10-CM | POA: Diagnosis not present

## 2016-03-01 DIAGNOSIS — E1165 Type 2 diabetes mellitus with hyperglycemia: Secondary | ICD-10-CM | POA: Diagnosis not present

## 2016-03-01 DIAGNOSIS — E782 Mixed hyperlipidemia: Secondary | ICD-10-CM | POA: Diagnosis not present

## 2016-03-01 DIAGNOSIS — Z0000 Encounter for general adult medical examination without abnormal findings: Secondary | ICD-10-CM | POA: Diagnosis not present

## 2016-03-01 DIAGNOSIS — I10 Essential (primary) hypertension: Secondary | ICD-10-CM | POA: Diagnosis not present

## 2016-03-01 DIAGNOSIS — M859 Disorder of bone density and structure, unspecified: Secondary | ICD-10-CM | POA: Diagnosis not present

## 2016-03-08 DIAGNOSIS — E782 Mixed hyperlipidemia: Secondary | ICD-10-CM | POA: Diagnosis not present

## 2016-03-08 DIAGNOSIS — I10 Essential (primary) hypertension: Secondary | ICD-10-CM | POA: Diagnosis not present

## 2016-03-08 DIAGNOSIS — E1165 Type 2 diabetes mellitus with hyperglycemia: Secondary | ICD-10-CM | POA: Diagnosis not present

## 2016-03-08 DIAGNOSIS — M150 Primary generalized (osteo)arthritis: Secondary | ICD-10-CM | POA: Diagnosis not present

## 2016-03-13 DIAGNOSIS — M9901 Segmental and somatic dysfunction of cervical region: Secondary | ICD-10-CM | POA: Diagnosis not present

## 2016-03-13 DIAGNOSIS — M5031 Other cervical disc degeneration,  high cervical region: Secondary | ICD-10-CM | POA: Diagnosis not present

## 2016-03-13 DIAGNOSIS — M542 Cervicalgia: Secondary | ICD-10-CM | POA: Diagnosis not present

## 2016-03-16 DIAGNOSIS — M542 Cervicalgia: Secondary | ICD-10-CM | POA: Diagnosis not present

## 2016-03-16 DIAGNOSIS — M9901 Segmental and somatic dysfunction of cervical region: Secondary | ICD-10-CM | POA: Diagnosis not present

## 2016-03-16 DIAGNOSIS — M5031 Other cervical disc degeneration,  high cervical region: Secondary | ICD-10-CM | POA: Diagnosis not present

## 2016-03-22 DIAGNOSIS — M9901 Segmental and somatic dysfunction of cervical region: Secondary | ICD-10-CM | POA: Diagnosis not present

## 2016-03-22 DIAGNOSIS — M5031 Other cervical disc degeneration,  high cervical region: Secondary | ICD-10-CM | POA: Diagnosis not present

## 2016-03-22 DIAGNOSIS — M542 Cervicalgia: Secondary | ICD-10-CM | POA: Diagnosis not present

## 2016-04-03 DIAGNOSIS — M5031 Other cervical disc degeneration,  high cervical region: Secondary | ICD-10-CM | POA: Diagnosis not present

## 2016-04-03 DIAGNOSIS — M542 Cervicalgia: Secondary | ICD-10-CM | POA: Diagnosis not present

## 2016-04-03 DIAGNOSIS — M9901 Segmental and somatic dysfunction of cervical region: Secondary | ICD-10-CM | POA: Diagnosis not present

## 2016-04-27 ENCOUNTER — Encounter (INDEPENDENT_AMBULATORY_CARE_PROVIDER_SITE_OTHER): Payer: Self-pay

## 2016-04-27 ENCOUNTER — Encounter: Payer: Self-pay | Admitting: Cardiovascular Disease

## 2016-04-27 ENCOUNTER — Ambulatory Visit (INDEPENDENT_AMBULATORY_CARE_PROVIDER_SITE_OTHER): Payer: Medicare Other | Admitting: Cardiovascular Disease

## 2016-04-27 VITALS — BP 138/81 | HR 92 | Ht 62.0 in | Wt 207.0 lb

## 2016-04-27 DIAGNOSIS — I472 Ventricular tachycardia: Secondary | ICD-10-CM | POA: Diagnosis not present

## 2016-04-27 DIAGNOSIS — I471 Supraventricular tachycardia: Secondary | ICD-10-CM | POA: Diagnosis not present

## 2016-04-27 DIAGNOSIS — I4729 Other ventricular tachycardia: Secondary | ICD-10-CM

## 2016-04-27 DIAGNOSIS — I4719 Other supraventricular tachycardia: Secondary | ICD-10-CM

## 2016-04-27 NOTE — Progress Notes (Signed)
Patient ID: Lynn Hardy, female   DOB: 12-Jul-1931, 82 y.o.   MRN: [id number]      Reason for office visit NSVT, PAT, HTN, presyncope  Lynn Hardy had an episode of sudden severe weakness and presyncope in 2014ago. At that time she had a normal echo and normal nuclear stress test. A 30 day event monitor showed bursts of ectopic atrial tachycardia as well as a few episodes of nonsustained ventricular tachycardia (the longest consisting of only about 10 beats). None of the arrhythmic events was associated with symptoms while wearing the monitor.  At that time we switched her antihypertensive medication to a beta blocker. She has not had any new events and feels well.   She stopped taking the beta blocker and all her other medicines and supplements several months ago. She feels great. She is physically active and denies dyspnea or angina or chest not had syncope, presyncope, dizziness or lightheadedness, palpitations, leg edema or other cardiovascular complaints   No Known Allergies  No current outpatient prescriptions on file.   No current facility-administered medications for this visit.     Past Medical History:  Diagnosis Date  . NSVT (nonsustained ventricular tachycardia) ([place])   . PAT (paroxysmal atrial tachycardia) ([facility])     No past surgical history on file.  Family History  Problem Relation Age of Onset  . Tuberculosis Father 36    Social History   Social History  . Marital status: Divorced    Spouse name: N/A  . Number of children: N/A  . Years of education: N/A   Occupational History  . Not on file.   Social History Main Topics  . Smoking status: Former Smoker    Quit date: 01/15/1998  . Smokeless tobacco: Never Used  . Alcohol use No  . Drug use: No  . Sexual activity: Not on file   Other Topics Concern  . Not on file   Social History Narrative  . No narrative on file    Review of systems: The patient specifically denies any chest pain  at rest or with exertion, dyspnea with exertion, orthopnea, paroxysmal nocturnal dyspnea, syncope, palpitations, focal neurological deficits, intermittent claudication, lower extremity edema, unexplained weight gain, cough, hemoptysis or wheezing.  The patient also denies abdominal pain, nausea, vomiting, dysphagia, diarrhea, constipation, polyuria, polydipsia, dysuria, hematuria, frequency, urgency, abnormal bleeding or bruising, fever, chills, unexpected weight changes, mood swings, change in skin or hair texture, change in voice quality, auditory or visual problems, allergic reactions or rashes, new musculoskeletal complaints other than usual "aches and pains".   PHYSICAL EXAM BP (!) 158/88   Pulse 92   Ht 5\' 2"  (1.575 m)   Wt 207 lb (93.9 kg)   BMI 37.86 kg/m   General: Alert, oriented x3, no distress Head: no evidence of trauma, PERRL, EOMI, no exophtalmos or lid lag, no myxedema, no xanthelasma; normal ears, nose and oropharynx Neck: normal jugular venous pulsations and no hepatojugular reflux; brisk carotid pulses without delay and no carotid bruits Chest: clear to auscultation, no signs of consolidation by percussion or palpation, normal fremitus, symmetrical and full respiratory excursions Cardiovascular: normal position and quality of the apical impulse, regular rhythm, normal first and second heart sounds, no murmurs, rubs or gallops Abdomen: no tenderness or distention, no masses by palpation, no abnormal pulsatility or arterial bruits, normal bowel sounds, no hepatosplenomegaly Extremities: no clubbing, cyanosis or edema; 2+ radial, ulnar and brachial pulses bilaterally; 2+ right femoral, posterior tibial and dorsalis pedis pulses; 2+  left femoral, posterior tibial and dorsalis pedis pulses; no subclavian or femoral bruits Neurological: grossly nonfocal   EKG: NSR, QS V1-V2 (old)  ASSESSMENT AND PLAN  Lynn Hardy had some atrial and ventricular nonsustained arrhythmia detected  incidentally on an event monitor. These were consistently asymptomatic. She has decided to stop all her medications and despite this continues to feel great and has normal blood pressure. She did not have any evidence of structural heart disease by previous evaluation. As long as she remains asymptomatic I don't think further cardiology intervention is needed. I do recommend that she try to lose weight. She is moderately to severely obese.  Patient Instructions  Dr Sallyanne Kuster recommends that you follow-up with him as needed.   Charan Prieto  Sanda Klein, MD, [facility] [phone] office [phone] pager

## 2016-04-27 NOTE — Patient Instructions (Signed)
Dr Croitoru recommends that you follow-up with him as needed. 

## 2016-05-18 ENCOUNTER — Other Ambulatory Visit: Payer: Self-pay

## 2016-05-28 DIAGNOSIS — M5134 Other intervertebral disc degeneration, thoracic region: Secondary | ICD-10-CM | POA: Diagnosis not present

## 2016-05-28 DIAGNOSIS — M50322 Other cervical disc degeneration at C5-C6 level: Secondary | ICD-10-CM | POA: Diagnosis not present

## 2016-05-28 DIAGNOSIS — M9902 Segmental and somatic dysfunction of thoracic region: Secondary | ICD-10-CM | POA: Diagnosis not present

## 2016-05-28 DIAGNOSIS — M9901 Segmental and somatic dysfunction of cervical region: Secondary | ICD-10-CM | POA: Diagnosis not present

## 2016-06-06 DIAGNOSIS — Z23 Encounter for immunization: Secondary | ICD-10-CM | POA: Diagnosis not present

## 2016-10-10 DIAGNOSIS — E782 Mixed hyperlipidemia: Secondary | ICD-10-CM | POA: Diagnosis not present

## 2016-10-10 DIAGNOSIS — R410 Disorientation, unspecified: Secondary | ICD-10-CM | POA: Diagnosis not present

## 2016-10-10 DIAGNOSIS — N390 Urinary tract infection, site not specified: Secondary | ICD-10-CM | POA: Diagnosis not present

## 2016-10-10 DIAGNOSIS — F0390 Unspecified dementia without behavioral disturbance: Secondary | ICD-10-CM | POA: Diagnosis not present

## 2016-10-10 DIAGNOSIS — I10 Essential (primary) hypertension: Secondary | ICD-10-CM | POA: Diagnosis not present

## 2016-10-12 DIAGNOSIS — M5136 Other intervertebral disc degeneration, lumbar region: Secondary | ICD-10-CM | POA: Diagnosis not present

## 2016-10-12 DIAGNOSIS — M9903 Segmental and somatic dysfunction of lumbar region: Secondary | ICD-10-CM | POA: Diagnosis not present

## 2016-10-12 DIAGNOSIS — M9904 Segmental and somatic dysfunction of sacral region: Secondary | ICD-10-CM | POA: Diagnosis not present

## 2016-10-12 DIAGNOSIS — M9905 Segmental and somatic dysfunction of pelvic region: Secondary | ICD-10-CM | POA: Diagnosis not present

## 2016-10-16 ENCOUNTER — Encounter: Payer: Self-pay | Admitting: Neurology

## 2016-10-16 ENCOUNTER — Ambulatory Visit (INDEPENDENT_AMBULATORY_CARE_PROVIDER_SITE_OTHER): Payer: Medicare Other | Admitting: Neurology

## 2016-10-16 VITALS — BP 182/87 | HR 77 | Ht 62.0 in | Wt 201.2 lb

## 2016-10-16 DIAGNOSIS — I480 Paroxysmal atrial fibrillation: Secondary | ICD-10-CM

## 2016-10-16 DIAGNOSIS — I6349 Cerebral infarction due to embolism of other cerebral artery: Secondary | ICD-10-CM

## 2016-10-16 DIAGNOSIS — F0390 Unspecified dementia without behavioral disturbance: Secondary | ICD-10-CM | POA: Diagnosis not present

## 2016-10-16 DIAGNOSIS — E538 Deficiency of other specified B group vitamins: Secondary | ICD-10-CM | POA: Diagnosis not present

## 2016-10-16 DIAGNOSIS — R410 Disorientation, unspecified: Secondary | ICD-10-CM

## 2016-10-16 MED ORDER — MEMANTINE HCL-DONEPEZIL HCL ER 28-10 MG PO CP24: 1.0000 | ORAL_CAPSULE | Freq: Every day | ORAL | 11 refills | Status: DC
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
## 2016-10-16 NOTE — Patient Instructions (Addendum)
Remember to drink plenty of fluid, eat healthy meals and do not skip any meals. Try to eat protein with a every meal and eat a healthy snack such as fruit or nuts in between meals. Try to keep a regular sleep-wake schedule and try to exercise daily, particularly in the form of walking, 20-30 minutes a day, if you can.   As far as your medications are concerned, I would like to suggest; continue namzeric  As far as diagnostic testing: Labs, EEG and MRI brain  I would like to see you back in 4 months, sooner if we need to. Please call us with any interim questions, concerns, problems, updates or refill requests.   Our phone number is [phone]. We also have an after hours call service for urgent matters and there is a physician on-call for urgent questions. For any emergencies you know to call 911 or go to the nearest emergency room

## 2016-10-18 ENCOUNTER — Telehealth: Payer: Self-pay | Admitting: *Deleted

## 2016-10-18 NOTE — Telephone Encounter (Signed)
-----   Message from Melvenia Beam, MD sent at 10/18/2016  9:21 AM EST ----- llabs normal

## 2016-10-18 NOTE — Telephone Encounter (Signed)
Called and spoke to daughter, [name] about normal labs per AA,MD note. She verbalized understanding.  She questioned when they were going to hear about scheduling MRI.  Advised their order was sent to [facility] and they can call [phone] to schedule. She verbalized understanding.

## 2016-10-19 ENCOUNTER — Telehealth: Payer: Self-pay | Admitting: *Deleted

## 2016-10-19 LAB — RPR: RPR Ser Ql: NONREACTIVE

## 2016-10-19 LAB — B12 AND FOLATE PANEL
Folate: 12.8 ng/mL (ref >3.0)
VITAMIN B 12: 410 pg/mL (ref 232–1245)

## 2016-10-19 LAB — VITAMIN B1: Thiamine: 133.9 nmol/L (ref 66.5–200.0)

## 2016-10-19 NOTE — Telephone Encounter (Signed)
Already spoke to daughter about normal labs yesterday. She other phone note.

## 2016-10-19 NOTE — Telephone Encounter (Signed)
-----   Message from Antonia B Ahern, MD sent at 10/19/2016  9:28 AM EST ----- Labs normal 

## 2016-10-25 ENCOUNTER — Ambulatory Visit
Admission: RE | Admit: 2016-10-25 | Discharge: 2016-10-25 | Disposition: A | Discharge status: Home / Self Care | Payer: Medicare Other | Source: Ambulatory Visit | Attending: Neurology | Admitting: Neurology

## 2016-10-25 DIAGNOSIS — F0390 Unspecified dementia without behavioral disturbance: Secondary | ICD-10-CM

## 2016-10-25 DIAGNOSIS — M5136 Other intervertebral disc degeneration, lumbar region: Secondary | ICD-10-CM | POA: Diagnosis not present

## 2016-10-25 DIAGNOSIS — E538 Deficiency of other specified B group vitamins: Secondary | ICD-10-CM

## 2016-10-25 DIAGNOSIS — M9903 Segmental and somatic dysfunction of lumbar region: Secondary | ICD-10-CM | POA: Diagnosis not present

## 2016-10-25 DIAGNOSIS — R410 Disorientation, unspecified: Secondary | ICD-10-CM | POA: Diagnosis not present

## 2016-10-25 DIAGNOSIS — M9904 Segmental and somatic dysfunction of sacral region: Secondary | ICD-10-CM | POA: Diagnosis not present

## 2016-10-25 DIAGNOSIS — M9905 Segmental and somatic dysfunction of pelvic region: Secondary | ICD-10-CM | POA: Diagnosis not present

## 2016-10-26 DIAGNOSIS — M9904 Segmental and somatic dysfunction of sacral region: Secondary | ICD-10-CM | POA: Diagnosis not present

## 2016-10-26 DIAGNOSIS — M9903 Segmental and somatic dysfunction of lumbar region: Secondary | ICD-10-CM | POA: Diagnosis not present

## 2016-10-26 DIAGNOSIS — M9905 Segmental and somatic dysfunction of pelvic region: Secondary | ICD-10-CM | POA: Diagnosis not present

## 2016-10-26 DIAGNOSIS — M5136 Other intervertebral disc degeneration, lumbar region: Secondary | ICD-10-CM | POA: Diagnosis not present

## 2016-10-29 ENCOUNTER — Ambulatory Visit (INDEPENDENT_AMBULATORY_CARE_PROVIDER_SITE_OTHER): Payer: Medicare Other | Admitting: Neurology

## 2016-10-29 DIAGNOSIS — M9904 Segmental and somatic dysfunction of sacral region: Secondary | ICD-10-CM | POA: Diagnosis not present

## 2016-10-29 DIAGNOSIS — M9903 Segmental and somatic dysfunction of lumbar region: Secondary | ICD-10-CM | POA: Diagnosis not present

## 2016-10-29 DIAGNOSIS — M5136 Other intervertebral disc degeneration, lumbar region: Secondary | ICD-10-CM | POA: Diagnosis not present

## 2016-10-29 DIAGNOSIS — R410 Disorientation, unspecified: Secondary | ICD-10-CM

## 2016-10-29 DIAGNOSIS — M9905 Segmental and somatic dysfunction of pelvic region: Secondary | ICD-10-CM | POA: Diagnosis not present
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
## 2016-10-30 ENCOUNTER — Telehealth: Payer: Self-pay | Admitting: Neurology

## 2016-10-30 DIAGNOSIS — M9905 Segmental and somatic dysfunction of pelvic region: Secondary | ICD-10-CM | POA: Diagnosis not present

## 2016-10-30 DIAGNOSIS — M9903 Segmental and somatic dysfunction of lumbar region: Secondary | ICD-10-CM | POA: Diagnosis not present

## 2016-10-30 DIAGNOSIS — M5136 Other intervertebral disc degeneration, lumbar region: Secondary | ICD-10-CM | POA: Diagnosis not present

## 2016-10-30 DIAGNOSIS — M9904 Segmental and somatic dysfunction of sacral region: Secondary | ICD-10-CM | POA: Diagnosis not present

## 2016-10-30 NOTE — Telephone Encounter (Signed)
Spoke to daughter about the embolic appearing strokes. Will complete stroke workup including imaging of the blood vessels of the head and neck, echo, 30-day heart monitoring and we can also consider an TEE and loop as well when the workup is complete, we can discuss at follow up appt. Should start on daily asa. EEG was slowed without epileptiform activity.   IMPRESSION:  This MRI of the brain without contrast shows the following: 1.    There is a moderate sized subacute stroke involving the left occipital lobe and smaller subacute strokes involving the left superior cerebellum and subcortical frontal lobe.    2.    T2/FLAIR hyperintense foci in the pons and hemispheres consistent with chronic microvascular ischemic change. 3.    Moderate cortical atrophy, more pronounced in the mesial temporal lobes.

## 2016-11-02 ENCOUNTER — Other Ambulatory Visit: Payer: Self-pay | Admitting: Neurology

## 2016-11-02 DIAGNOSIS — R7309 Other abnormal glucose: Secondary | ICD-10-CM

## 2016-11-02 DIAGNOSIS — I480 Paroxysmal atrial fibrillation: Secondary | ICD-10-CM

## 2016-11-02 DIAGNOSIS — I6349 Cerebral infarction due to embolism of other cerebral artery: Secondary | ICD-10-CM

## 2016-11-02 NOTE — Addendum Note (Signed)
Addended by: Sarina Ill B on: 11/02/2016 11:15 AM   Modules accepted: Orders

## 2016-11-02 NOTE — Telephone Encounter (Signed)
Completed by Anette Guarneri.

## 2016-11-02 NOTE — Telephone Encounter (Signed)
Delsa Sale - Let patient's daughter know I ordered CAT scans to look at blood vessels in the head and neck, echocardiogram and 30-day heart event monitor. She also needs fasting labs in our office at least several days before the CT so I can get a recent kidney function for the contrast and also check her cholesterol and hgba1c since she has had a stroke. Thanks.

## 2016-11-07 DIAGNOSIS — F0390 Unspecified dementia without behavioral disturbance: Secondary | ICD-10-CM | POA: Diagnosis not present

## 2016-11-07 DIAGNOSIS — E782 Mixed hyperlipidemia: Secondary | ICD-10-CM | POA: Diagnosis not present

## 2016-11-07 DIAGNOSIS — M9904 Segmental and somatic dysfunction of sacral region: Secondary | ICD-10-CM | POA: Diagnosis not present

## 2016-11-07 DIAGNOSIS — M5136 Other intervertebral disc degeneration, lumbar region: Secondary | ICD-10-CM | POA: Diagnosis not present

## 2016-11-07 DIAGNOSIS — M9905 Segmental and somatic dysfunction of pelvic region: Secondary | ICD-10-CM | POA: Diagnosis not present

## 2016-11-07 DIAGNOSIS — E1165 Type 2 diabetes mellitus with hyperglycemia: Secondary | ICD-10-CM | POA: Diagnosis not present

## 2016-11-07 DIAGNOSIS — M9903 Segmental and somatic dysfunction of lumbar region: Secondary | ICD-10-CM | POA: Diagnosis not present

## 2016-11-19 ENCOUNTER — Other Ambulatory Visit: Payer: Self-pay | Admitting: Neurology

## 2016-11-19 DIAGNOSIS — I6349 Cerebral infarction due to embolism of other cerebral artery: Secondary | ICD-10-CM

## 2016-11-19 DIAGNOSIS — I639 Cerebral infarction, unspecified: Secondary | ICD-10-CM

## 2016-11-19 DIAGNOSIS — I480 Paroxysmal atrial fibrillation: Secondary | ICD-10-CM

## 2016-11-20 ENCOUNTER — Emergency Department (HOSPITAL_COMMUNITY): Payer: Medicare Other

## 2016-11-20 ENCOUNTER — Encounter (HOSPITAL_COMMUNITY): Payer: Self-pay

## 2016-11-20 ENCOUNTER — Other Ambulatory Visit: Payer: Self-pay

## 2016-11-20 ENCOUNTER — Encounter: Payer: Self-pay | Admitting: *Deleted

## 2016-11-20 ENCOUNTER — Encounter (INDEPENDENT_AMBULATORY_CARE_PROVIDER_SITE_OTHER): Payer: Self-pay

## 2016-11-20 ENCOUNTER — Ambulatory Visit (HOSPITAL_BASED_OUTPATIENT_CLINIC_OR_DEPARTMENT_OTHER): Payer: Medicare Other

## 2016-11-20 ENCOUNTER — Ambulatory Visit (INDEPENDENT_AMBULATORY_CARE_PROVIDER_SITE_OTHER): Payer: Medicare Other

## 2016-11-20 ENCOUNTER — Emergency Department (HOSPITAL_COMMUNITY)
Admission: EM | Admit: 2016-11-20 | Discharge: 2016-11-20 | Disposition: A | Discharge status: Home / Self Care | Payer: Medicare Other | Attending: Emergency Medicine | Admitting: Emergency Medicine

## 2016-11-20 DIAGNOSIS — I10 Essential (primary) hypertension: Secondary | ICD-10-CM | POA: Insufficient documentation

## 2016-11-20 DIAGNOSIS — I480 Paroxysmal atrial fibrillation: Secondary | ICD-10-CM | POA: Diagnosis not present

## 2016-11-20 DIAGNOSIS — R531 Weakness: Secondary | ICD-10-CM | POA: Diagnosis not present

## 2016-11-20 DIAGNOSIS — Z8673 Personal history of transient ischemic attack (TIA), and cerebral infarction without residual deficits: Secondary | ICD-10-CM | POA: Insufficient documentation

## 2016-11-20 DIAGNOSIS — Z87891 Personal history of nicotine dependence: Secondary | ICD-10-CM | POA: Insufficient documentation

## 2016-11-20 DIAGNOSIS — I6349 Cerebral infarction due to embolism of other cerebral artery: Secondary | ICD-10-CM | POA: Diagnosis not present

## 2016-11-20 DIAGNOSIS — I471 Supraventricular tachycardia: Secondary | ICD-10-CM | POA: Insufficient documentation

## 2016-11-20 DIAGNOSIS — Z6836 Body mass index (BMI) 36.0-36.9, adult: Secondary | ICD-10-CM

## 2016-11-20 DIAGNOSIS — Z7982 Long term (current) use of aspirin: Secondary | ICD-10-CM | POA: Insufficient documentation

## 2016-11-20 DIAGNOSIS — R51 Headache: Secondary | ICD-10-CM | POA: Diagnosis not present

## 2016-11-20 DIAGNOSIS — E669 Obesity, unspecified: Secondary | ICD-10-CM

## 2016-11-20 DIAGNOSIS — I472 Ventricular tachycardia: Secondary | ICD-10-CM | POA: Insufficient documentation

## 2016-11-20 DIAGNOSIS — R519 Headache, unspecified: Secondary | ICD-10-CM

## 2016-11-20 DIAGNOSIS — I639 Cerebral infarction, unspecified: Secondary | ICD-10-CM | POA: Diagnosis not present

## 2016-11-20 HISTORY — DX: Unspecified atrial fibrillation: I48.91

## 2016-11-20 HISTORY — DX: Cerebral infarction, unspecified: I63.9

## 2016-11-20 MED ORDER — ACETAMINOPHEN 325 MG PO TABS
650.0000 mg | ORAL_TABLET | Freq: Once | ORAL | Status: AC
  Administered 2016-11-20: 650 mg via ORAL
  Filled 2016-11-20: qty 2

## 2016-11-20 NOTE — Progress Notes (Signed)
Patient ID: Lynn Hardy, female   DOB: 08/19/1931, 83 y.o.   MRN: [id number]  Patient presented for echocardiogram and cardiac event monitor this morning.  She complained of severe headache at back of head and numbness of right arm.  Patients daughter states, patient has had multiple TIA's since diagnosed with CVA.  Reviewed with nursing triage and Clinical supervisor, Janan Halter, RN., who recommended patient go straight over to the emergency room.

## 2016-11-20 NOTE — ED Provider Notes (Signed)
Medical screening examination/treatment/procedure(s) were conducted as a shared visit with non-physician practitioner(s) and myself.  I personally evaluated the patient during the encounter.   EKG Interpretation None      83 year old female who presents with intermittent posterior headache. History of prior CVA with residual right lip numbness. Has had intermittent sharp episodic headaches in the back of her head for one year, increasing in frequency over past few weeks. Has had current headache on and off for 2 days. Associated with bilateral leg weakness and brief episode of numbness/tingling over right hand that improved after shaking it out. On arrival, currently w/o headache. She has normal neurological exam. CT head visualized and does not show acute intracranial processes. Currently without s/s of stroke. Headaches similar to prior over past year. I do not think she requires acute MRI imaging or additional work-up at this time. Did recommend close follow-up with her neurologist as outpatient and discussed strict return instructions.    Forde Dandy, MD 11/20/16 [phone]

## 2016-11-20 NOTE — ED Provider Notes (Signed)
[facility] DEPT Provider Note   CSN: [id number] Arrival date & time: 11/20/16  [id number]     History   Chief Complaint Chief Complaint  Patient presents with  . Headache    HPI Lynn Hardy is a 83 y.o. female.  HPI   83 year old female with a history of dementia, hypertension, stroke presents today with complaints of headache.  She reports the headache is occipital in nature, similar to previous, started 2 days ago.  Patients daughter is at bedside who provides part of patient's history.  They note patient has had ongoing memory lapses and episodes of confusion.  Patient was referred to neurology and seen in January by Dr. Lavell Anchors for dementia.  MRI at that time showed old infarct.  Patient notes some numbness around the lips that has been present since that time.  At the time of my evaluation patient reports that her headache resolved, and he is not having any neurological deficits.  Patient was at cardiologist office and referred over for evaluation of headache and right arm numbness.  Per chart review patient was seen by Dr. Lavell Anchors with outpatient workup including MRI, labs, EEG.  Patient was updated after results were given.  Per daughter no significant changes since that time.  Patient denies any dizziness, lightheadedness, or difficulty with ambulation.   Past Medical History:  Diagnosis Date  . Atrial fibrillation ([place])   . NSVT (nonsustained ventricular tachycardia) ([place])   . PAT (paroxysmal atrial tachycardia) ([place])   . Stroke [facility])     Patient Active Problem List   Diagnosis Date Noted  . Dementia 10/16/2016  . Morbid obesity ([place]) 04/29/2016  . Ankle edema 05/08/2013  . HTN (hypertension) 05/08/2013  . NSVT (nonsustained ventricular tachycardia) ([place]) 05/08/2013  . Ectopic atrial tachycardia ([place]) 05/08/2013    Past Surgical History:  Procedure Laterality Date  . ABDOMINAL HYSTERECTOMY    . no surgical history      OB History    No data  available       Home Medications    Prior to Admission medications   Medication Sig Start Date End Date Taking? Authorizing Provider  acetaminophen (TYLENOL) 325 MG tablet Take 650 mg by mouth every 6 (six) hours as needed for mild pain.   Yes Historical Provider, MD  aspirin 81 MG chewable tablet Chew 81 mg by mouth daily.   Yes Historical Provider, MD  Memantine HCl-Donepezil HCl (NAMZARIC) 28-10 MG CP24 Take 1 tablet by mouth daily. 10/16/16  Yes Melvenia Beam, MD  Omega-3 Fatty Acids (FISH OIL) 1000 MG CAPS Take 1 capsule by mouth daily.   Yes Historical Provider, MD  pyridOXINE (VITAMIN B-6) 100 MG tablet Take 100 mg by mouth daily.   Yes Historical Provider, MD  VITAMIN A PO Take 1 capsule by mouth daily.   Yes Historical Provider, MD  vitamin B-12 (CYANOCOBALAMIN) 100 MCG tablet Take 100 mcg by mouth daily.   Yes Historical Provider, MD  vitamin E 100 UNIT capsule Take 100 Units by mouth daily.   Yes Historical Provider, MD    Family History Family History  Problem Relation Age of Onset  . Tuberculosis Father 72  . Alzheimer's disease Sister     Social History Social History  Substance Use Topics  . Smoking status: Former Smoker    Quit date: 01/15/1998  . Smokeless tobacco: Never Used  . Alcohol use No     Allergies   Patient has no known allergies.   Review of  Systems Review of Systems  All other systems reviewed and are negative.    Physical Exam Updated Vital Signs BP (!) 140/53   Pulse 69   Temp 98.8 F (37.1 C) (Oral)   Resp 18   Ht 5' 2.5" (1.588 m)   Wt 91.2 kg   SpO2 96%   BMI 36.18 kg/m   Physical Exam  Constitutional: She is oriented to person, place, and time. She appears well-developed and well-nourished.  HENT:  Head: Normocephalic and atraumatic.  Eyes: Conjunctivae are normal. Pupils are equal, round, and reactive to light. Right eye exhibits no discharge. Left eye exhibits no discharge. No scleral icterus.  Neck: Normal range of  motion. No JVD present. No tracheal deviation present.  Pulmonary/Chest: Effort normal. No stridor.  Neurological: She is alert and oriented to person, place, and time. She has normal strength. No cranial nerve deficit or sensory deficit. Coordination normal. GCS eye subscore is 4. GCS verbal subscore is 5. GCS motor subscore is 6.  Psychiatric: She has a normal mood and affect. Her behavior is normal. Judgment and thought content normal.  Nursing note and vitals reviewed.   ED Treatments / Results  Labs (all labs ordered are listed, but only abnormal results are displayed) Labs Reviewed - No data to display  EKG  EKG Interpretation None       Radiology Ct Head Wo Contrast  Result Date: 11/20/2016 CLINICAL DATA:  Sickle per Pt having posterior headache x 2 days. Numbness right hand. Bilateral leg weakness. EXAM: CT HEAD WITHOUT CONTRAST TECHNIQUE: Contiguous axial images were obtained from the base of the skull through the vertex without intravenous contrast. COMPARISON:  Brain MRI 10/25/2016 FINDINGS: Brain: No acute intracranial hemorrhage. No focal mass lesion. No CT evidence of acute infarction. No midline shift or mass effect. No hydrocephalus. Basilar cisterns are patent. Remote infarction in the medial LEFT occipital lobe. White matter infarction in the LEFT external capsule unchanged from comparison MRI. Scatter subcortical and periventricular white matter hypodensities. Atrophy and proportional ventricular dilatation unchanged Vascular: No hyperdense vessel or unexpected calcification. Skull: Normal. Negative for fracture or focal lesion. Sinuses/Orbits: No acute finding. Other: None. IMPRESSION: 1. No acute intracranial findings. 2. Remote LEFT occipital infarction. 3. Deep white matter infarctions and white matter microvascular disease unchanged from prior. Electronically Signed   By: Suzy Bouchard M.D.   On: 11/20/2016 10:41    Procedures Procedures (including critical care  time)  Medications Ordered in ED Medications  acetaminophen (TYLENOL) tablet 650 mg (650 mg Oral Given 11/20/16 1551)     Initial Impression / Assessment and Plan / ED Course  I have reviewed the triage vital signs and the nursing notes.  Pertinent labs & imaging results that were available during my care of the patient were reviewed by me and considered in my medical decision making (see chart for details).      Final Clinical Impressions(s) / ED Diagnoses   Final diagnoses:  Nonintractable headache, unspecified chronicity pattern, unspecified headache type    Labs:   Imaging: CT head without  Consults:  Therapeutics:  Discharge Meds:   Assessment/Plan: Very well-appearing patient presents with complaints of headache.  She no longer has a headache, has no neurological deficits.  Patient does have a history of stroke in the past.  I have very low suspicion that she is having recurrence of strokelike symptoms.  Patient is currently followed by neurology with outpatient stroke workup.  Based on her nonspecific presentation with no significant  findings patient will be discharged home with continue neurological follow-up and strict return precautions.  Patient verbalized understanding and agreement to today's plan had no further questions or concerns at time of discharge.  Patient case was discussed with attending physician who agreed to my assessment and plan.      New Prescriptions Discharge Medication List as of 11/20/2016  3:47 PM       Okey Regal, PA-C 11/20/16 [id number]    Forde Dandy, MD 11/21/16 [phone]

## 2016-11-20 NOTE — ED Triage Notes (Signed)
Per Pt and family, coming from home with complaints of headache that started two days ago along with numbness in the right hand, shuffling of the feet, and complaints of bilateral leg weakness.

## 2016-11-20 NOTE — ED Notes (Signed)
Pt tolerated crackers well.

## 2016-11-20 NOTE — Discharge Instructions (Signed)
Please follow-up with neurology for further evaluation and management.  Return immediately if you develop any new or worsening signs or symptoms.

## 2016-11-20 NOTE — ED Notes (Signed)
Pt verbalized understanding of discharge instructions and denies any further questions at this time.   

## 2016-11-20 NOTE — ED Notes (Signed)
PA student at bedside.

## 2016-11-22 ENCOUNTER — Encounter: Payer: Self-pay | Admitting: Internal Medicine

## 2016-11-26 ENCOUNTER — Telehealth: Payer: Self-pay | Admitting: *Deleted

## 2016-11-26 NOTE — Telephone Encounter (Signed)
Encounter opened in error

## 2016-11-28 ENCOUNTER — Ambulatory Visit
Admission: RE | Admit: 2016-11-28 | Discharge: 2016-11-28 | Disposition: A | Discharge status: Home / Self Care | Payer: Medicare Other | Source: Ambulatory Visit | Attending: Neurology | Admitting: Neurology

## 2016-11-28 DIAGNOSIS — I6349 Cerebral infarction due to embolism of other cerebral artery: Secondary | ICD-10-CM

## 2016-11-28 DIAGNOSIS — I63233 Cerebral infarction due to unspecified occlusion or stenosis of bilateral carotid arteries: Secondary | ICD-10-CM | POA: Diagnosis not present

## 2016-11-28 MED ORDER — IOPAMIDOL (ISOVUE-370) INJECTION 76%
75.0000 mL | Freq: Once | INTRAVENOUS | Status: AC | PRN
  Administered 2016-11-28: 75 mL via INTRAVENOUS

## 2016-11-29 ENCOUNTER — Encounter: Payer: Self-pay | Admitting: Internal Medicine

## 2016-11-29 ENCOUNTER — Ambulatory Visit (INDEPENDENT_AMBULATORY_CARE_PROVIDER_SITE_OTHER): Payer: Medicare Other | Admitting: Internal Medicine

## 2016-11-29 VITALS — BP 132/82 | HR 74 | Ht 62.5 in | Wt 193.0 lb

## 2016-11-29 DIAGNOSIS — I639 Cerebral infarction, unspecified: Secondary | ICD-10-CM | POA: Diagnosis not present

## 2016-11-29 NOTE — Patient Instructions (Signed)
Medication Instructions:  Your physician recommends that you continue on your current medications as directed. Please refer to the Current Medication list given to you today.   Labwork: None   Testing/Procedures: None   Follow-Up: Your physician recommends that you schedule a follow-up appointment as needed with Dr End.       If you need a refill on your cardiac medications before your next appointment, please call your pharmacy.   
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
## 2016-11-30 ENCOUNTER — Telehealth: Payer: Self-pay | Admitting: *Deleted

## 2016-11-30 NOTE — Telephone Encounter (Signed)
LVM with office number for daughter, [name] , on [place] requesting call back re: results on patient. Advised the office closes at noon today, reopens Monday 8 am.

## 2016-12-06 ENCOUNTER — Telehealth: Payer: Self-pay

## 2016-12-06 NOTE — Telephone Encounter (Signed)
Called w/ results and recommendations. May call back w/ additional questions/concerns.

## 2016-12-06 NOTE — Telephone Encounter (Signed)
-----   Message from Melvenia Beam, MD sent at 11/29/2016  7:23 PM EDT ----- There was nothing remarkable on the CTA of the head and neck, there was some expected atherosclerosis but nothing significant or worrisome. Incidentally they did notice some cervical spinal stenosis and we may consider an image of the neck if three are any symptoms there. thanks

## 2017-01-01 ENCOUNTER — Telehealth: Payer: Self-pay

## 2017-01-01 NOTE — Telephone Encounter (Signed)
-----   Message from Melvenia Beam, MD sent at 12/27/2016  7:07 PM EDT ----- There were no sustained arrhythmias or prolonged pauses, unremarkable

## 2017-01-01 NOTE — Telephone Encounter (Signed)
Called pt w/ unremarkable results. May call back w/ any questions/concerns.

## 2017-01-14 ENCOUNTER — Ambulatory Visit (INDEPENDENT_AMBULATORY_CARE_PROVIDER_SITE_OTHER): Payer: Medicare Other | Admitting: Neurology

## 2017-01-14 ENCOUNTER — Encounter (INDEPENDENT_AMBULATORY_CARE_PROVIDER_SITE_OTHER): Payer: Self-pay

## 2017-01-14 ENCOUNTER — Encounter: Payer: Self-pay | Admitting: Neurology

## 2017-01-14 VITALS — Ht 62.0 in | Wt 186.0 lb

## 2017-01-14 DIAGNOSIS — I639 Cerebral infarction, unspecified: Secondary | ICD-10-CM | POA: Diagnosis not present

## 2017-01-14 DIAGNOSIS — F0390 Unspecified dementia without behavioral disturbance: Secondary | ICD-10-CM

## 2017-01-14 MED ORDER — ASPIRIN EC 325 MG PO TBEC: 325.0000 mg | DELAYED_RELEASE_TABLET | Freq: Every day | ORAL | 0 refills | Status: DC
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
## 2017-01-14 NOTE — Patient Instructions (Signed)
Remember to drink plenty of fluid, eat healthy meals and do not skip any meals. Try to eat protein with a every meal and eat a healthy snack such as fruit or nuts in between meals. Try to keep a regular sleep-wake schedule and try to exercise daily, particularly in the form of walking, 20-30 minutes a day, if you can.   As far as your medications are concerned, I would like to suggest: Daily aspirin 325mg  daily.  Recommend checking lipid panel (goal LDL < 70) and HgbA1c (3 month marker of glucose)  As far as diagnostic testing:   I would like to see you back in 9 months, sooner if we need to. Please call us with any interim questions, concerns, problems, updates or refill requests.   Our phone number is [phone]. We also have an after hours call service for urgent matters and there is a physician on-call for urgent questions. For any emergencies you know to call 911 or go to the nearest emergency room

## 2017-02-07 DIAGNOSIS — E1165 Type 2 diabetes mellitus with hyperglycemia: Secondary | ICD-10-CM | POA: Diagnosis not present

## 2017-02-07 DIAGNOSIS — E782 Mixed hyperlipidemia: Secondary | ICD-10-CM | POA: Diagnosis not present

## 2017-02-14 DIAGNOSIS — F0390 Unspecified dementia without behavioral disturbance: Secondary | ICD-10-CM | POA: Diagnosis not present

## 2017-02-14 DIAGNOSIS — E782 Mixed hyperlipidemia: Secondary | ICD-10-CM | POA: Diagnosis not present

## 2017-02-14 DIAGNOSIS — I10 Essential (primary) hypertension: Secondary | ICD-10-CM | POA: Diagnosis not present

## 2017-02-14 DIAGNOSIS — E1165 Type 2 diabetes mellitus with hyperglycemia: Secondary | ICD-10-CM | POA: Diagnosis not present

## 2017-05-10 ENCOUNTER — Ambulatory Visit (INDEPENDENT_AMBULATORY_CARE_PROVIDER_SITE_OTHER): Payer: Medicare Other | Admitting: Physician Assistant

## 2017-05-10 ENCOUNTER — Ambulatory Visit (INDEPENDENT_AMBULATORY_CARE_PROVIDER_SITE_OTHER): Payer: Medicare Other

## 2017-05-10 ENCOUNTER — Encounter: Payer: Self-pay | Admitting: Physician Assistant

## 2017-05-10 VITALS — BP 158/78 | HR 72 | Temp 98.1°F | Resp 16 | Ht 62.0 in | Wt 173.6 lb

## 2017-05-10 DIAGNOSIS — M25531 Pain in right wrist: Secondary | ICD-10-CM | POA: Diagnosis not present

## 2017-05-10 DIAGNOSIS — I639 Cerebral infarction, unspecified: Secondary | ICD-10-CM

## 2017-05-10 NOTE — Progress Notes (Signed)
    05/13/2017 9:42 AM   DOB: 1930/11/20 / MRN: [id number]  SUBJECTIVE:  Lynn Hardy is a 83 y.o. female presenting for right wrist pain and bruising that started after a fall about 4 days ago.  Tells me the pain is improving and has been taking tylenol with good relief. No ROM deficit or weakness.   She has No Known Allergies.   She  has a past medical history of Asthma; Atrial fibrillation ([place]); Cataract; NSVT (nonsustained ventricular tachycardia) ([facility]); PAT (paroxysmal atrial tachycardia) ([place]); and Stroke ([place]).    She  reports that she quit smoking about 19 years ago. She has a 10.00 pack-year smoking history. She has never used smokeless tobacco. She reports that she does not drink alcohol or use drugs. She  has no sexual activity history on file. The patient  has a past surgical history that includes Abdominal hysterectomy.  Her family history includes Alzheimer's disease in her sister; Heart attack in her mother; Hypertension in her mother; Parkinson's disease in her sister; Tuberculosis (age of onset: 75) in her father.  Review of Systems  Constitutional: Negative for chills, diaphoresis and fever.  Respiratory: Negative for cough, hemoptysis, sputum production, shortness of breath and wheezing.   Cardiovascular: Negative for chest pain.  Gastrointestinal: Negative for nausea.  Musculoskeletal: Positive for falls and joint pain.  Skin: Negative for rash.  Neurological: Negative for dizziness.    The problem list and medications were reviewed and updated by myself where necessary and exist elsewhere in the encounter.   OBJECTIVE:  BP (!) 158/78   Pulse 72   Temp 98.1 F (36.7 C) (Oral)   Resp 16   Ht 5\' 2"  (1.575 m)   Wt 173 lb 9.6 oz (78.7 kg)   SpO2 96%   BMI 31.75 kg/m   Dg Wrist Complete Right  Result Date: 05/10/2017 CLINICAL DATA:  Right wrist pain after fall. EXAM: RIGHT WRIST - COMPLETE 3+ VIEW COMPARISON:  None. FINDINGS: There is no evidence  of fracture or dislocation. Severe narrowing and sclerosis of first carpometacarpal joint is noted. Soft tissues are unremarkable. IMPRESSION: Osteoarthritis of the first carpometacarpal joint. No acute abnormality seen in the right wrist. Electronically Signed   By: Marijo Conception, M.D.   On: 05/10/2017 17:02     Physical Exam  Constitutional: She is active.  Cardiovascular: Normal rate, regular rhythm, S1 normal, S2 normal, normal heart sounds and intact distal pulses.  Exam reveals no gallop, no friction rub and no decreased pulses.   No murmur heard. Pulmonary/Chest: Effort normal. No tachypnea. She has no rales.  Abdominal: She exhibits no distension.  Musculoskeletal: She exhibits no edema.       Hands: Neurological: She is alert.  Skin: Skin is warm.    No results found for this or any previous visit (from the past 72 hour(s)).  No results found.  ASSESSMENT AND PLAN:  Lynn Hardy was seen today for right wrist injury.  Diagnoses and all orders for this visit:  Acute wrist pain, right: Negative for fracture.  -     DG Wrist Complete Right; Future    The patient is advised to call or return to clinic if she does not see an improvement in symptoms, or to seek the care of the closest emergency department if she worsens with the above plan.   Philis Fendt, MHS, PA-C [facility] 05/13/2017 9:42 AM

## 2017-05-10 NOTE — Patient Instructions (Addendum)
There are no breaks on your Xray.  Wear you brace for the next 7 days.  If you do not continue to improve please call in about 2 weeks so I can order an MRI. If you are getting worse then please call sooner so I can order the MRI sooner. Use tylenol 1000 mg every 8 hours for wrist pain.     IF you received an x-ray today, you will receive an invoice from [facility]. Please contact [facility] at [phone] with questions or concerns regarding your invoice.   IF you received labwork today, you will receive an invoice from [facility]. Please contact [facility] at [phone] with questions or concerns regarding your invoice.   Our billing staff will not be able to assist you with questions regarding bills from these companies.  You will be contacted with the lab results as soon as they are available. The fastest way to get your results is to activate your My Chart account. Instructions are located on the last page of this paperwork. If you have not heard from [place] regarding the results in 2 weeks, please contact this office.

## 2017-06-18 DIAGNOSIS — Z23 Encounter for immunization: Secondary | ICD-10-CM | POA: Diagnosis not present

## 2017-06-18 DIAGNOSIS — E782 Mixed hyperlipidemia: Secondary | ICD-10-CM | POA: Diagnosis not present

## 2017-06-18 DIAGNOSIS — N390 Urinary tract infection, site not specified: Secondary | ICD-10-CM | POA: Diagnosis not present

## 2017-06-18 DIAGNOSIS — I10 Essential (primary) hypertension: Secondary | ICD-10-CM | POA: Diagnosis not present

## 2017-06-18 DIAGNOSIS — E1165 Type 2 diabetes mellitus with hyperglycemia: Secondary | ICD-10-CM | POA: Diagnosis not present

## 2017-06-18 DIAGNOSIS — Z0000 Encounter for general adult medical examination without abnormal findings: Secondary | ICD-10-CM | POA: Diagnosis not present

## 2017-06-25 DIAGNOSIS — E782 Mixed hyperlipidemia: Secondary | ICD-10-CM | POA: Diagnosis not present

## 2017-06-25 DIAGNOSIS — I10 Essential (primary) hypertension: Secondary | ICD-10-CM | POA: Diagnosis not present

## 2017-06-25 DIAGNOSIS — I052 Rheumatic mitral stenosis with insufficiency: Secondary | ICD-10-CM | POA: Diagnosis not present

## 2017-06-25 DIAGNOSIS — E1165 Type 2 diabetes mellitus with hyperglycemia: Secondary | ICD-10-CM | POA: Diagnosis not present

## 2017-06-25 DIAGNOSIS — F0390 Unspecified dementia without behavioral disturbance: Secondary | ICD-10-CM | POA: Diagnosis not present

## 2017-10-16 ENCOUNTER — Ambulatory Visit: Payer: Medicare Other | Admitting: Adult Health

## 2017-10-21 ENCOUNTER — Encounter: Payer: Self-pay | Admitting: Adult Health

## 2017-10-21 ENCOUNTER — Ambulatory Visit (INDEPENDENT_AMBULATORY_CARE_PROVIDER_SITE_OTHER): Payer: Medicare Other | Admitting: Adult Health

## 2017-10-21 VITALS — BP 195/72 | HR 72 | Ht 62.0 in | Wt 176.6 lb

## 2017-10-21 DIAGNOSIS — R413 Other amnesia: Secondary | ICD-10-CM

## 2017-10-21 DIAGNOSIS — Z8673 Personal history of transient ischemic attack (TIA), and cerebral infarction without residual deficits: Secondary | ICD-10-CM

## 2017-10-21 NOTE — Patient Instructions (Addendum)
Your Plan:  Continue Aricept and Namenda for the memory Continue aspirin  Blood Pressure goal <130/90 Cholesterol LDL <70 HgbA1c <6.5 % Call PCP about Blood pressure  Thank you for coming to see [place] at [facility]. I hope we have been able to provide you high quality care today.  You may receive a patient satisfaction survey over the next few weeks. We would appreciate your feedback and comments so that we may continue to improve ourselves and the health of our patients.
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
## 2017-10-22 NOTE — Progress Notes (Signed)
I reviewed note and agree with plan.   Penni Bombard, MD 02/22/5448, 2:01 PM Certified in Neurology, Neurophysiology and Neuroimaging  [facility] [address], [place], [place] [address] [phone]

## 2017-11-19 DIAGNOSIS — F0390 Unspecified dementia without behavioral disturbance: Secondary | ICD-10-CM | POA: Diagnosis not present

## 2017-11-19 DIAGNOSIS — I10 Essential (primary) hypertension: Secondary | ICD-10-CM | POA: Diagnosis not present

## 2017-11-19 DIAGNOSIS — E782 Mixed hyperlipidemia: Secondary | ICD-10-CM | POA: Diagnosis not present

## 2017-11-19 DIAGNOSIS — E1165 Type 2 diabetes mellitus with hyperglycemia: Secondary | ICD-10-CM | POA: Diagnosis not present

## 2017-11-26 DIAGNOSIS — I052 Rheumatic mitral stenosis with insufficiency: Secondary | ICD-10-CM | POA: Diagnosis not present

## 2017-11-26 DIAGNOSIS — F0390 Unspecified dementia without behavioral disturbance: Secondary | ICD-10-CM | POA: Diagnosis not present

## 2017-11-26 DIAGNOSIS — E782 Mixed hyperlipidemia: Secondary | ICD-10-CM | POA: Diagnosis not present

## 2017-11-26 DIAGNOSIS — I10 Essential (primary) hypertension: Secondary | ICD-10-CM | POA: Diagnosis not present

## 2017-11-26 DIAGNOSIS — E1165 Type 2 diabetes mellitus with hyperglycemia: Secondary | ICD-10-CM | POA: Diagnosis not present

## 2018-04-21 ENCOUNTER — Ambulatory Visit: Payer: Medicare Other | Admitting: Adult Health

## 2018-07-01 DIAGNOSIS — Z0000 Encounter for general adult medical examination without abnormal findings: Secondary | ICD-10-CM | POA: Diagnosis not present

## 2018-07-01 DIAGNOSIS — M8580 Other specified disorders of bone density and structure, unspecified site: Secondary | ICD-10-CM | POA: Diagnosis not present

## 2018-07-01 DIAGNOSIS — Z780 Asymptomatic menopausal state: Secondary | ICD-10-CM | POA: Diagnosis not present

## 2018-07-01 DIAGNOSIS — Z23 Encounter for immunization: Secondary | ICD-10-CM | POA: Diagnosis not present

## 2018-07-01 DIAGNOSIS — I052 Rheumatic mitral stenosis with insufficiency: Secondary | ICD-10-CM | POA: Diagnosis not present

## 2018-07-01 DIAGNOSIS — I10 Essential (primary) hypertension: Secondary | ICD-10-CM | POA: Diagnosis not present

## 2018-07-01 DIAGNOSIS — N390 Urinary tract infection, site not specified: Secondary | ICD-10-CM | POA: Diagnosis not present

## 2018-07-01 DIAGNOSIS — E1165 Type 2 diabetes mellitus with hyperglycemia: Secondary | ICD-10-CM | POA: Diagnosis not present

## 2018-07-01 DIAGNOSIS — E782 Mixed hyperlipidemia: Secondary | ICD-10-CM | POA: Diagnosis not present

## 2018-07-08 DIAGNOSIS — E1165 Type 2 diabetes mellitus with hyperglycemia: Secondary | ICD-10-CM | POA: Diagnosis not present

## 2018-07-08 DIAGNOSIS — E782 Mixed hyperlipidemia: Secondary | ICD-10-CM | POA: Diagnosis not present

## 2018-07-08 DIAGNOSIS — M810 Age-related osteoporosis without current pathological fracture: Secondary | ICD-10-CM | POA: Diagnosis not present

## 2018-07-08 DIAGNOSIS — I10 Essential (primary) hypertension: Secondary | ICD-10-CM | POA: Diagnosis not present

## 2018-07-08 DIAGNOSIS — I052 Rheumatic mitral stenosis with insufficiency: Secondary | ICD-10-CM | POA: Diagnosis not present

## 2018-07-08 DIAGNOSIS — F0390 Unspecified dementia without behavioral disturbance: Secondary | ICD-10-CM | POA: Diagnosis not present

## 2018-07-08 DIAGNOSIS — N393 Stress incontinence (female) (male): Secondary | ICD-10-CM | POA: Diagnosis not present

## 2019-01-26 DIAGNOSIS — I10 Essential (primary) hypertension: Secondary | ICD-10-CM | POA: Diagnosis not present

## 2019-01-26 DIAGNOSIS — E1165 Type 2 diabetes mellitus with hyperglycemia: Secondary | ICD-10-CM | POA: Diagnosis not present

## 2019-01-26 DIAGNOSIS — E782 Mixed hyperlipidemia: Secondary | ICD-10-CM | POA: Diagnosis not present

## 2019-02-02 DIAGNOSIS — Z23 Encounter for immunization: Secondary | ICD-10-CM | POA: Diagnosis not present

## 2019-02-02 DIAGNOSIS — E1165 Type 2 diabetes mellitus with hyperglycemia: Secondary | ICD-10-CM | POA: Diagnosis not present

## 2019-02-02 DIAGNOSIS — E782 Mixed hyperlipidemia: Secondary | ICD-10-CM | POA: Diagnosis not present

## 2019-02-02 DIAGNOSIS — F0390 Unspecified dementia without behavioral disturbance: Secondary | ICD-10-CM | POA: Diagnosis not present

## 2019-02-02 DIAGNOSIS — I10 Essential (primary) hypertension: Secondary | ICD-10-CM | POA: Diagnosis not present

## 2019-03-04 ENCOUNTER — Inpatient Hospital Stay (HOSPITAL_COMMUNITY)
Admission: EM | Admit: 2019-03-04 | Discharge: 2019-03-18 | DRG: 377 | Disposition: A | Discharge status: Skilled Nursing Facility | Payer: Medicare Other | Attending: Internal Medicine | Admitting: Internal Medicine

## 2019-03-04 ENCOUNTER — Emergency Department (HOSPITAL_COMMUNITY): Payer: Medicare Other

## 2019-03-04 ENCOUNTER — Encounter (HOSPITAL_COMMUNITY): Payer: Self-pay

## 2019-03-04 ENCOUNTER — Other Ambulatory Visit: Payer: Self-pay

## 2019-03-04 DIAGNOSIS — N183 Chronic kidney disease, stage 3 (moderate): Secondary | ICD-10-CM | POA: Diagnosis present

## 2019-03-04 DIAGNOSIS — Z8489 Family history of other specified conditions: Secondary | ICD-10-CM

## 2019-03-04 DIAGNOSIS — A419 Sepsis, unspecified organism: Secondary | ICD-10-CM | POA: Diagnosis not present

## 2019-03-04 DIAGNOSIS — J9601 Acute respiratory failure with hypoxia: Secondary | ICD-10-CM | POA: Diagnosis not present

## 2019-03-04 DIAGNOSIS — Z20828 Contact with and (suspected) exposure to other viral communicable diseases: Secondary | ICD-10-CM | POA: Diagnosis not present

## 2019-03-04 DIAGNOSIS — R4182 Altered mental status, unspecified: Secondary | ICD-10-CM | POA: Diagnosis not present

## 2019-03-04 DIAGNOSIS — E870 Hyperosmolality and hypernatremia: Secondary | ICD-10-CM | POA: Diagnosis not present

## 2019-03-04 DIAGNOSIS — K5731 Diverticulosis of large intestine without perforation or abscess with bleeding: Secondary | ICD-10-CM | POA: Diagnosis not present

## 2019-03-04 DIAGNOSIS — N390 Urinary tract infection, site not specified: Secondary | ICD-10-CM | POA: Diagnosis not present

## 2019-03-04 DIAGNOSIS — I129 Hypertensive chronic kidney disease with stage 1 through stage 4 chronic kidney disease, or unspecified chronic kidney disease: Secondary | ICD-10-CM | POA: Diagnosis present

## 2019-03-04 DIAGNOSIS — N179 Acute kidney failure, unspecified: Secondary | ICD-10-CM | POA: Diagnosis present

## 2019-03-04 DIAGNOSIS — T83518A Infection and inflammatory reaction due to other urinary catheter, initial encounter: Secondary | ICD-10-CM | POA: Diagnosis not present

## 2019-03-04 DIAGNOSIS — Z7189 Other specified counseling: Secondary | ICD-10-CM

## 2019-03-04 DIAGNOSIS — K922 Gastrointestinal hemorrhage, unspecified: Secondary | ICD-10-CM

## 2019-03-04 DIAGNOSIS — K5730 Diverticulosis of large intestine without perforation or abscess without bleeding: Secondary | ICD-10-CM | POA: Diagnosis not present

## 2019-03-04 DIAGNOSIS — N3289 Other specified disorders of bladder: Secondary | ICD-10-CM | POA: Diagnosis present

## 2019-03-04 DIAGNOSIS — B356 Tinea cruris: Secondary | ICD-10-CM | POA: Diagnosis not present

## 2019-03-04 DIAGNOSIS — Z8249 Family history of ischemic heart disease and other diseases of the circulatory system: Secondary | ICD-10-CM

## 2019-03-04 DIAGNOSIS — J189 Pneumonia, unspecified organism: Secondary | ICD-10-CM

## 2019-03-04 DIAGNOSIS — R404 Transient alteration of awareness: Secondary | ICD-10-CM | POA: Diagnosis not present

## 2019-03-04 DIAGNOSIS — R4189 Other symptoms and signs involving cognitive functions and awareness: Secondary | ICD-10-CM | POA: Diagnosis present

## 2019-03-04 DIAGNOSIS — K625 Hemorrhage of anus and rectum: Secondary | ICD-10-CM

## 2019-03-04 DIAGNOSIS — Z87891 Personal history of nicotine dependence: Secondary | ICD-10-CM

## 2019-03-04 DIAGNOSIS — R000 Tachycardia, unspecified: Secondary | ICD-10-CM | POA: Diagnosis present

## 2019-03-04 DIAGNOSIS — N200 Calculus of kidney: Secondary | ICD-10-CM | POA: Diagnosis not present

## 2019-03-04 DIAGNOSIS — L98498 Non-pressure chronic ulcer of skin of other sites with other specified severity: Secondary | ICD-10-CM | POA: Diagnosis present

## 2019-03-04 DIAGNOSIS — E876 Hypokalemia: Secondary | ICD-10-CM | POA: Diagnosis present

## 2019-03-04 DIAGNOSIS — J45909 Unspecified asthma, uncomplicated: Secondary | ICD-10-CM | POA: Diagnosis present

## 2019-03-04 DIAGNOSIS — T17908A Unspecified foreign body in respiratory tract, part unspecified causing other injury, initial encounter: Secondary | ICD-10-CM

## 2019-03-04 DIAGNOSIS — K5790 Diverticulosis of intestine, part unspecified, without perforation or abscess without bleeding: Secondary | ICD-10-CM | POA: Diagnosis not present

## 2019-03-04 DIAGNOSIS — R32 Unspecified urinary incontinence: Secondary | ICD-10-CM | POA: Diagnosis present

## 2019-03-04 DIAGNOSIS — K649 Unspecified hemorrhoids: Secondary | ICD-10-CM | POA: Diagnosis present

## 2019-03-04 DIAGNOSIS — K5900 Constipation, unspecified: Secondary | ICD-10-CM | POA: Diagnosis not present

## 2019-03-04 DIAGNOSIS — R1084 Generalized abdominal pain: Secondary | ICD-10-CM | POA: Diagnosis not present

## 2019-03-04 DIAGNOSIS — E8770 Fluid overload, unspecified: Secondary | ICD-10-CM

## 2019-03-04 DIAGNOSIS — E875 Hyperkalemia: Secondary | ICD-10-CM | POA: Diagnosis not present

## 2019-03-04 DIAGNOSIS — I480 Paroxysmal atrial fibrillation: Secondary | ICD-10-CM | POA: Diagnosis not present

## 2019-03-04 DIAGNOSIS — Z66 Do not resuscitate: Secondary | ICD-10-CM | POA: Diagnosis present

## 2019-03-04 DIAGNOSIS — J690 Pneumonitis due to inhalation of food and vomit: Secondary | ICD-10-CM | POA: Diagnosis not present

## 2019-03-04 DIAGNOSIS — I959 Hypotension, unspecified: Secondary | ICD-10-CM | POA: Diagnosis not present

## 2019-03-04 DIAGNOSIS — K449 Diaphragmatic hernia without obstruction or gangrene: Secondary | ICD-10-CM | POA: Diagnosis present

## 2019-03-04 DIAGNOSIS — R197 Diarrhea, unspecified: Secondary | ICD-10-CM | POA: Diagnosis not present

## 2019-03-04 DIAGNOSIS — D72829 Elevated white blood cell count, unspecified: Secondary | ICD-10-CM

## 2019-03-04 DIAGNOSIS — Z1159 Encounter for screening for other viral diseases: Secondary | ICD-10-CM | POA: Diagnosis not present

## 2019-03-04 DIAGNOSIS — Z7982 Long term (current) use of aspirin: Secondary | ICD-10-CM

## 2019-03-04 DIAGNOSIS — Z781 Physical restraint status: Secondary | ICD-10-CM

## 2019-03-04 DIAGNOSIS — Z6832 Body mass index (BMI) 32.0-32.9, adult: Secondary | ICD-10-CM

## 2019-03-04 DIAGNOSIS — Y846 Urinary catheterization as the cause of abnormal reaction of the patient, or of later complication, without mention of misadventure at the time of the procedure: Secondary | ICD-10-CM | POA: Diagnosis present

## 2019-03-04 DIAGNOSIS — E872 Acidosis: Secondary | ICD-10-CM | POA: Diagnosis present

## 2019-03-04 DIAGNOSIS — Z820 Family history of epilepsy and other diseases of the nervous system: Secondary | ICD-10-CM

## 2019-03-04 DIAGNOSIS — Z515 Encounter for palliative care: Secondary | ICD-10-CM | POA: Diagnosis present

## 2019-03-04 DIAGNOSIS — R231 Pallor: Secondary | ICD-10-CM | POA: Diagnosis present

## 2019-03-04 DIAGNOSIS — R451 Restlessness and agitation: Secondary | ICD-10-CM | POA: Diagnosis not present

## 2019-03-04 DIAGNOSIS — K668 Other specified disorders of peritoneum: Secondary | ICD-10-CM | POA: Diagnosis not present

## 2019-03-04 DIAGNOSIS — Z8673 Personal history of transient ischemic attack (TIA), and cerebral infarction without residual deficits: Secondary | ICD-10-CM

## 2019-03-04 DIAGNOSIS — R001 Bradycardia, unspecified: Secondary | ICD-10-CM | POA: Diagnosis present

## 2019-03-04 DIAGNOSIS — F0390 Unspecified dementia without behavioral disturbance: Secondary | ICD-10-CM | POA: Diagnosis present

## 2019-03-04 DIAGNOSIS — Z831 Family history of other infectious and parasitic diseases: Secondary | ICD-10-CM

## 2019-03-04 DIAGNOSIS — R34 Anuria and oliguria: Secondary | ICD-10-CM | POA: Diagnosis present

## 2019-03-04 DIAGNOSIS — Z9181 History of falling: Secondary | ICD-10-CM

## 2019-03-04 DIAGNOSIS — Z79899 Other long term (current) drug therapy: Secondary | ICD-10-CM

## 2019-03-04 DIAGNOSIS — J9602 Acute respiratory failure with hypercapnia: Secondary | ICD-10-CM | POA: Diagnosis not present

## 2019-03-04 LAB — URINALYSIS, ROUTINE W REFLEX MICROSCOPIC
Bilirubin Urine: NEGATIVE
Glucose, UA: NEGATIVE mg/dL
Hgb urine dipstick: NEGATIVE
Ketones, ur: 5 mg/dL — AB
Leukocytes,Ua: NEGATIVE
Nitrite: NEGATIVE
Protein, ur: 30 mg/dL — AB
Specific Gravity, Urine: 1.021 (ref 1.005–1.030)
pH: 5 (ref 5.0–8.0)

## 2019-03-04 LAB — CBC WITH DIFFERENTIAL/PLATELET
Abs Immature Granulocytes: 0.12 10*3/uL — ABNORMAL HIGH (ref 0.00–0.07)
Basophils Absolute: 0 10*3/uL (ref 0.0–0.1)
Basophils Relative: 1 %
Eosinophils Absolute: 0 10*3/uL (ref 0.0–0.5)
Eosinophils Relative: 0 %
HCT: 45.5 % (ref 36.0–46.0)
Hemoglobin: 15.2 g/dL — ABNORMAL HIGH (ref 12.0–15.0)
Immature Granulocytes: 1 %
Lymphocytes Relative: 6 %
Lymphs Abs: 0.5 10*3/uL — ABNORMAL LOW (ref 0.7–4.0)
MCH: 30.3 pg (ref 26.0–34.0)
MCHC: 33.4 g/dL (ref 30.0–36.0)
MCV: 90.8 fL (ref 80.0–100.0)
Monocytes Absolute: 0.7 10*3/uL (ref 0.1–1.0)
Monocytes Relative: 8 %
Neutro Abs: 7.3 10*3/uL (ref 1.7–7.7)
Neutrophils Relative %: 84 %
Platelets: 137 10*3/uL — ABNORMAL LOW (ref 150–400)
RBC: 5.01 MIL/uL (ref 3.87–5.11)
RDW: 13.1 % (ref 11.5–15.5)
WBC: 8.7 10*3/uL (ref 4.0–10.5)
nRBC: 0 % (ref 0.0–0.2)

## 2019-03-04 LAB — SAMPLE TO BLOOD BANK

## 2019-03-04 LAB — SARS CORONAVIRUS 2 BY RT PCR (HOSPITAL ORDER, PERFORMED IN ~~LOC~~ HOSPITAL LAB): SARS Coronavirus 2: NEGATIVE

## 2019-03-04 LAB — POC OCCULT BLOOD, ED: Fecal Occult Bld: POSITIVE — AB

## 2019-03-04 LAB — PROTIME-INR
INR: 1 (ref 0.8–1.2)
Prothrombin Time: 12.8 seconds (ref 11.4–15.2)

## 2019-03-04 LAB — COMPREHENSIVE METABOLIC PANEL
ALT: 31 U/L (ref 0–44)
AST: 27 U/L (ref 15–41)
Albumin: 3.7 g/dL (ref 3.5–5.0)
Alkaline Phosphatase: 68 U/L (ref 38–126)
Anion gap: 15 (ref 5–15)
BUN: 36 mg/dL — ABNORMAL HIGH (ref 8–23)
CO2: 25 mmol/L (ref 22–32)
Calcium: 9.7 mg/dL (ref 8.9–10.3)
Chloride: 104 mmol/L (ref 98–111)
Creatinine, Ser: 1.36 mg/dL — ABNORMAL HIGH (ref 0.44–1.00)
GFR calc Af Amer: 41 mL/min — ABNORMAL LOW (ref >60)
GFR calc non Af Amer: 35 mL/min — ABNORMAL LOW (ref >60)
Glucose, Bld: 140 mg/dL — ABNORMAL HIGH (ref 70–99)
Potassium: 3.5 mmol/L (ref 3.5–5.1)
Sodium: 144 mmol/L (ref 135–145)
Total Bilirubin: 2.2 mg/dL — ABNORMAL HIGH (ref 0.3–1.2)
Total Protein: 6.3 g/dL — ABNORMAL LOW (ref 6.5–8.1)

## 2019-03-04 LAB — LACTIC ACID, PLASMA
Lactic Acid, Venous: 2.2 mmol/L (ref 0.5–1.9)
Lactic Acid, Venous: 1.8 mmol/L (ref 0.5–1.9)

## 2019-03-04 LAB — TROPONIN I: Troponin I: 0.04 ng/mL (ref <0.03)

## 2019-03-04 MED ORDER — ONDANSETRON HCL 4 MG PO TABS: 4.0000 mg | ORAL_TABLET | Freq: Four times a day (QID) | ORAL | Status: DC | PRN

## 2019-03-04 MED ORDER — ACETAMINOPHEN 650 MG RE SUPP: 650.0000 mg | Freq: Four times a day (QID) | RECTAL | Status: DC | PRN

## 2019-03-04 MED ORDER — VITAMIN B-12 100 MCG PO TABS
100.0000 ug | ORAL_TABLET | Freq: Every day | ORAL | Status: DC
  Administered 2019-03-05 – 2019-03-13 (×9): 100 ug via ORAL
  Filled 2019-03-04 (×10): qty 1

## 2019-03-04 MED ORDER — ACETAMINOPHEN 325 MG PO TABS
650.0000 mg | ORAL_TABLET | Freq: Four times a day (QID) | ORAL | Status: DC | PRN
  Administered 2019-03-06 – 2019-03-10 (×5): 650 mg via ORAL
  Filled 2019-03-04 (×5): qty 2

## 2019-03-04 MED ORDER — ADULT MULTIVITAMIN W/MINERALS CH
1.0000 | ORAL_TABLET | Freq: Every day | ORAL | Status: DC
  Administered 2019-03-05 – 2019-03-13 (×9): 1 via ORAL
  Filled 2019-03-04 (×9): qty 1

## 2019-03-04 MED ORDER — LORAZEPAM 2 MG/ML IJ SOLN
0.2500 mg | Freq: Once | INTRAMUSCULAR | Status: AC
  Administered 2019-03-04: 0.25 mg via INTRAVENOUS
  Filled 2019-03-04: qty 1

## 2019-03-04 MED ORDER — ONDANSETRON HCL 4 MG/2ML IJ SOLN: 4.0000 mg | Freq: Four times a day (QID) | INTRAMUSCULAR | Status: DC | PRN

## 2019-03-04 MED ORDER — PANTOPRAZOLE SODIUM 40 MG PO TBEC
40.0000 mg | DELAYED_RELEASE_TABLET | Freq: Every day | ORAL | Status: DC
  Administered 2019-03-05 – 2019-03-13 (×9): 40 mg via ORAL
  Filled 2019-03-04 (×9): qty 1

## 2019-03-04 MED ORDER — VITAMIN D3 25 MCG (1000 UNIT) PO TABS
1000.0000 [IU] | ORAL_TABLET | Freq: Every day | ORAL | Status: DC
  Administered 2019-03-05 – 2019-03-13 (×9): 1000 [IU] via ORAL
  Filled 2019-03-04 (×9): qty 1

## 2019-03-04 MED ORDER — MEMANTINE HCL 5 MG PO TABS
10.0000 mg | ORAL_TABLET | Freq: Two times a day (BID) | ORAL | Status: DC
  Administered 2019-03-04 – 2019-03-13 (×19): 10 mg via ORAL
  Filled 2019-03-04 (×19): qty 1

## 2019-03-04 MED ORDER — DONEPEZIL HCL 10 MG PO TABS
10.0000 mg | ORAL_TABLET | Freq: Every day | ORAL | Status: DC
  Administered 2019-03-04 – 2019-03-13 (×10): 10 mg via ORAL
  Filled 2019-03-04 (×10): qty 1

## 2019-03-04 MED ORDER — B COMPLEX-C PO TABS
1.0000 | ORAL_TABLET | Freq: Every day | ORAL | Status: DC
  Administered 2019-03-05 – 2019-03-13 (×9): 1 via ORAL
  Filled 2019-03-04 (×10): qty 1

## 2019-03-04 MED ORDER — SODIUM CHLORIDE 0.9 % IV BOLUS
500.0000 mL | Freq: Once | INTRAVENOUS | Status: AC
  Administered 2019-03-04: 500 mL via INTRAVENOUS

## 2019-03-04 MED ORDER — SODIUM CHLORIDE 0.9 % IV SOLN
INTRAVENOUS | Status: DC
  Administered 2019-03-04: 14:00:00 via INTRAVENOUS

## 2019-03-04 NOTE — ED Notes (Signed)
ED TO INPATIENT HANDOFF REPORT  Name/Age/Gender Lynn Hardy 85 y.o. female  Code Status   Home/SNF/Other Home  Chief Complaint blood in stool  Level of Care/Admitting Diagnosis ED Disposition    ED Disposition Condition [place]: [place] [100102]  Level of Care: Telemetry [5]  Admit to tele based on following criteria: Other see comments  Comments: rectal bleeding  Covid Evaluation: Screening Protocol (No Symptoms)  Diagnosis: Rectal bleeding [017793]  Admitting Physician: Tawni Millers [[id number]]  Attending Physician: Tawni Millers [[id number]]  PT Class (Do Not Modify): Observation [104]  PT Acc Code (Do Not Modify): Observation [10022]       Medical History Past Medical History:  Diagnosis Date  . Asthma   . Atrial fibrillation ([place])   . Cataract   . NSVT (nonsustained ventricular tachycardia) ([place])   . PAT (paroxysmal atrial tachycardia) ([place])   . Stroke [facility])     Allergies No Known Allergies  IV Location/Drains/Wounds Patient Lines/Drains/Airways Status   Active Line/Drains/Airways    Name:   Placement date:   Placement time:   Site:   Days:   Peripheral IV 03/04/19 Right Antecubital   03/04/19    1243    Antecubital   less than 1          Labs/Imaging Results for orders placed or performed during the hospital encounter of 03/04/19 (from the past 48 hour(s))  Lactic acid, plasma     Status: Abnormal   Collection Time: 03/04/19 12:04 PM  Result Value Ref Range   Lactic Acid, Venous 2.2 (HH) 0.5 - 1.9 mmol/L    Comment: CRITICAL RESULT CALLED TO, READ BACK BY AND VERIFIED WITHSharlyn Bologna RN AT 1316 03/04/19 MULLINS,T Performed at [facility], [place] [address], [place], [place] [address]   POC occult blood, ED     Status: Abnormal   Collection Time: 03/04/19 12:36 PM  Result Value Ref Range   Fecal Occult Bld POSITIVE (A) NEGATIVE  Protime-INR      Status: None   Collection Time: 03/04/19 12:42 PM  Result Value Ref Range   Prothrombin Time 12.8 11.4 - 15.2 seconds   INR 1.0 0.8 - 1.2    Comment: (NOTE) INR goal varies based on device and disease states. Performed at [facility], [place] [address] [place], [place] [address]   CBC with Differential     Status: Abnormal   Collection Time: 03/04/19 12:42 PM  Result Value Ref Range   WBC 8.7 4.0 - 10.5 K/uL   RBC 5.01 3.87 - 5.11 MIL/uL   Hemoglobin 15.2 (H) 12.0 - 15.0 g/dL   HCT 45.5 36.0 - 46.0 %   MCV 90.8 80.0 - 100.0 fL   MCH 30.3 26.0 - 34.0 pg   MCHC 33.4 30.0 - 36.0 g/dL   RDW 13.1 11.5 - 15.5 %   Platelets 137 (L) 150 - 400 K/uL   nRBC 0.0 0.0 - 0.2 %   Neutrophils Relative % 84 %   Neutro Abs 7.3 1.7 - 7.7 K/uL   Lymphocytes Relative 6 %   Lymphs Abs 0.5 (L) 0.7 - 4.0 K/uL   Monocytes Relative 8 %   Monocytes Absolute 0.7 0.1 - 1.0 K/uL   Eosinophils Relative 0 %   Eosinophils Absolute 0.0 0.0 - 0.5 K/uL   Basophils Relative 1 %   Basophils Absolute 0.0 0.0 - 0.1 K/uL   Immature Granulocytes 1 %  Abs Immature Granulocytes 0.12 (H) 0.00 - 0.07 K/uL    Comment: Performed at [facility], [place] [address], [place], [place] [address]  Troponin I - Once     Status: Abnormal   Collection Time: 03/04/19 12:42 PM  Result Value Ref Range   Troponin I 0.04 (HH) <0.03 ng/mL    Comment: CRITICAL RESULT CALLED TO, READ BACK BY AND VERIFIED WITHSharlyn Bologna RN AT 1323 03/04/19 MULLINS,T Performed at [facility], [place] [address], [place], [place] [address]   Comprehensive metabolic panel     Status: Abnormal   Collection Time: 03/04/19 12:42 PM  Result Value Ref Range   Sodium 144 135 - 145 mmol/L   Potassium 3.5 3.5 - 5.1 mmol/L   Chloride 104 98 - 111 mmol/L   CO2 25 22 - 32 mmol/L   Glucose, Bld 140 (H) 70 - 99 mg/dL   BUN 36 (H) 8 - 23 mg/dL   Creatinine, Ser 1.36 (H) 0.44 - 1.00 mg/dL   Calcium 9.7 8.9 -  10.3 mg/dL   Total Protein 6.3 (L) 6.5 - 8.1 g/dL   Albumin 3.7 3.5 - 5.0 g/dL   AST 27 15 - 41 U/L   ALT 31 0 - 44 U/L   Alkaline Phosphatase 68 38 - 126 U/L   Total Bilirubin 2.2 (H) 0.3 - 1.2 mg/dL   GFR calc non Af Amer 35 (L) >60 mL/min   GFR calc Af Amer 41 (L) >60 mL/min   Anion gap 15 5 - 15    Comment: Performed at [facility], [place] [address] [place], [place] [address]  Sample to Blood Bank     Status: None   Collection Time: 03/04/19 12:42 PM  Result Value Ref Range   Blood Bank Specimen SAMPLE AVAILABLE FOR TESTING    Sample Expiration      03/07/2019,2359 Performed at [facility], [place] [address] [place], [place] [address]   Urinalysis, Routine w reflex microscopic     Status: Abnormal   Collection Time: 03/04/19  1:15 PM  Result Value Ref Range   Color, Urine AMBER (A) YELLOW    Comment: BIOCHEMICALS MAY BE AFFECTED BY COLOR   APPearance CLOUDY (A) CLEAR   Specific Gravity, Urine 1.021 1.005 - 1.030   pH 5.0 5.0 - 8.0   Glucose, UA NEGATIVE NEGATIVE mg/dL   Hgb urine dipstick NEGATIVE NEGATIVE   Bilirubin Urine NEGATIVE NEGATIVE   Ketones, ur 5 (A) NEGATIVE mg/dL   Protein, ur 30 (A) NEGATIVE mg/dL   Nitrite NEGATIVE NEGATIVE   Leukocytes,Ua NEGATIVE NEGATIVE   RBC / HPF 6-10 0 - 5 RBC/hpf   WBC, UA 6-10 0 - 5 WBC/hpf   Bacteria, UA RARE (A) NONE SEEN   Mucus PRESENT    Hyaline Casts, UA PRESENT     Comment: Performed at [facility], [place] [address] [place], [place] [address]  Lactic acid, plasma     Status: None   Collection Time: 03/04/19  2:04 PM  Result Value Ref Range   Lactic Acid, Venous 1.8 0.5 - 1.9 mmol/L    Comment: Performed at [facility], [place] [address] [place],  [address]   Ct Abdomen Pelvis Wo Contrast  Result Date: 03/04/2019 CLINICAL DATA:  Abdominal pain and diarrhea. Bloody stools. EXAM: CT ABDOMEN AND PELVIS WITHOUT CONTRAST TECHNIQUE:  Multidetector CT imaging of the abdomen and pelvis was performed following the standard protocol without IV  contrast. COMPARISON:  None. FINDINGS: Lower chest: Large hiatal hernia. Hepatobiliary: No focal abnormality in the liver on this study without intravenous contrast. There is no evidence for gallstones, gallbladder wall thickening, or pericholecystic fluid. No intrahepatic or extrahepatic biliary dilation. Pancreas: No focal mass lesion. No dilatation of the main duct. No intraparenchymal cyst. No peripancreatic edema. Spleen: No splenomegaly. No focal mass lesion. Adrenals/Urinary Tract: No adrenal nodule or mass. 2 mm nonobstructing stone identified upper pole right kidney. Left kidney unremarkable. No evidence for hydroureter. Bladder is markedly distended. Stomach/Bowel: Large hiatal hernia with approximately 75% of the stomach in the thorax. Circumferential mild wall thickening in the distal stomach may be related to underdistention as there is no adjacent edema or inflammation. Duodenum is normally positioned as is the ligament of Treitz. No small bowel wall thickening. No small bowel dilatation. The terminal ileum is normal. The appendix is not visualized, but there is no edema or inflammation in the region of the cecum. No gross colonic mass. No colonic wall thickening. Diverticular changes are noted in the left colon without evidence of diverticulitis. Vascular/Lymphatic: There is abdominal aortic atherosclerosis without aneurysm. There is no gastrohepatic or hepatoduodenal ligament lymphadenopathy. No intraperitoneal or retroperitoneal lymphadenopathy. No pelvic sidewall lymphadenopathy. Reproductive: The uterus is surgically absent. There is no adnexal mass. Other: No intraperitoneal free fluid. Musculoskeletal: No worrisome lytic or sclerotic osseous abnormality. IMPRESSION: 1. No definite findings to explain the history of GI bleeding. 2. Marked distention of the urinary bladder. Dysfunction or  bladder outlet obstruction should be considered. 3. Large hiatal hernia with approximately 75% of the stomach in the thorax. Mild circumferential wall thickening noted in the antral region without adjacent edema or inflammation. This may be related to underdistention, but infection/inflammation a consideration. 4. 2 mm nonobstructing right renal stone. 5. Left colonic diverticulosis without diverticulitis. 6.  Aortic Atherosclerois (ICD10-170.0) Electronically Signed   By: Misty Stanley M.D.   On: 03/04/2019 15:31   Dg Chest Port 1 View  Result Date: 03/04/2019 CLINICAL DATA:  Altered mental status EXAM: PORTABLE CHEST 1 VIEW COMPARISON:  None. FINDINGS: The heart size and mediastinal contours are within normal limits. Both lungs are clear. The visualized skeletal structures are unremarkable. IMPRESSION: No active disease. Electronically Signed   By: Dorise Bullion III M.D   On: 03/04/2019 14:18    Pending Labs Unresulted Labs (From admission, onward)    Start     Ordered   03/04/19 1605  SARS Coronavirus 2 (CEPHEID - Performed in [place] hospital lab), Hosp Order  (Asymptomatic Patients Labs)  Once,   STAT    Question:  Rule Out  Answer:  Yes   03/04/19 1604   03/04/19 1204  Urine culture  ONCE - STAT,   STAT     03/04/19 1203   Signed and Held  Basic metabolic panel  Tomorrow morning,   R     Signed and Held   Signed and Held  CBC  Tomorrow morning,   R     Signed and Held          Vitals/Pain Today's Vitals   03/04/19 1143 03/04/19 1300 03/04/19 1430 03/04/19 1600  BP:  134/70 (!) 119/55 (!) 122/55  Pulse:  60 62 (!) 53  Resp:  16 19 18   Temp:      TempSrc:      SpO2:  100% 100% 95%  PainSc: 0-No pain       Isolation Precautions No active isolations  Medications  Medications  0.9 %  sodium chloride infusion ( Intravenous New Bag/Given (Non-Interop) 03/04/19 1418)  sodium chloride 0.9 % bolus 500 mL (500 mLs Intravenous New Bag/Given (Non-Interop) 03/04/19 1418)     Mobility walks with device

## 2019-03-04 NOTE — ED Provider Notes (Signed)
[place] DEPT Provider Note   CSN: [id number] Arrival date & time: 03/04/19  1128     History   Chief Complaint Chief Complaint  Patient presents with  . Rectal Bleeding    HPI Lynn Hardy is a 85 y.o. female.     The history is provided by the EMS personnel, the patient and a caregiver. The history is limited by the condition of the patient (Hx dementia).  Rectal Bleeding   Pt was seen at 1200.   Per EMS and pt's family:  Pt c/o generalized abd "pain" for th past 5 days.  Has been associated with multiple intermittent episodes of diarrhea. Pt's family states they went to check on her today and noticed "blood in her bed" and "dark red stool" BM.  Denies fevers, no N/V, back pain, no rash, no CP/SOB.   Past Medical History:  Diagnosis Date  . Asthma   . Atrial fibrillation ([place])   . Cataract   . NSVT (nonsustained ventricular tachycardia) ([place])   . PAT (paroxysmal atrial tachycardia) ([place])   . Stroke [facility])     Patient Active Problem List   Diagnosis Date Noted  . Dementia ([place]) 10/16/2016  . Morbid obesity ([place]) 04/29/2016  . Ankle edema 05/08/2013  . HTN (hypertension) 05/08/2013  . NSVT (nonsustained ventricular tachycardia) ([place]) 05/08/2013  . Ectopic atrial tachycardia ([place]) 05/08/2013    Past Surgical History:  Procedure Laterality Date  . ABDOMINAL HYSTERECTOMY       OB History   No obstetric history on file.      Home Medications    Prior to Admission medications   Medication Sig Start Date End Date Taking? Authorizing Provider  acetaminophen (TYLENOL) 500 MG tablet Take 500 mg by mouth every 6 (six) hours as needed for mild pain.     [provider]  aspirin 81 MG tablet Take 81 mg by mouth daily.    [provider]  aspirin EC 325 MG tablet Take 1 tablet (325 mg total) by mouth daily. 01/14/17   Melvenia Beam, MD  B Complex-C (B-COMPLEX WITH VITAMIN C) tablet Take 1 tablet by mouth  daily.    [provider]  Cholecalciferol (VITAMIN D3) 1000 units CAPS Take by mouth daily.    [provider]  donepezil (ARICEPT) 10 MG tablet Take 10 mg by mouth at bedtime.    [provider]  memantine (NAMENDA) 10 MG tablet Take 10 mg by mouth 2 (two) times daily.    [provider]  Multiple Vitamins-Minerals (MULTIVITAMIN WITH MINERALS) tablet Take 1 tablet by mouth daily.    [provider]  Omega-3 Fatty Acids (FISH OIL) 1000 MG CAPS Take 1 capsule by mouth daily.    [provider]  vitamin B-12 (CYANOCOBALAMIN) 100 MCG tablet Take 100 mcg by mouth daily.    [provider]  vitamin E 100 UNIT capsule Take 100 Units by mouth daily.    [provider]    Family History Family History  Problem Relation Age of Onset  . Tuberculosis Father 19  . Hypertension Mother   . Heart attack Mother        Older than 102.  . Alzheimer's disease Sister   . Parkinson's disease Sister     Social History Social History   Tobacco Use  . Smoking status: Former Smoker    Packs/day: 0.50    Years: 20.00    Pack years: 10.00  Quit date: 01/15/1998    Years since quitting: 21.1  . Smokeless tobacco: Never Used  Substance Use Topics  . Alcohol use: No  . Drug use: No     Allergies   Patient has no known allergies.   Review of Systems Review of Systems  Unable to perform ROS: Dementia  Gastrointestinal: Positive for hematochezia.     Physical Exam Updated Vital Signs BP (!) 122/56 (BP Location: Right Arm)   Pulse 64   Temp (!) 97.5 F (36.4 C) (Oral)   Resp 18   SpO2 99%    Patient Vitals for the past 24 hrs:  BP Temp Temp src Pulse Resp SpO2  03/04/19 1430 (!) 119/55 - - 62 19 100 %  03/04/19 1300 134/70 - - 60 16 100 %  03/04/19 1139 (!) 122/56 (!) 97.5 F (36.4 C) Oral 64 18 99 %   13:51 Orthostatic Vital Signs JS  Orthostatic Lying   BP- Lying: 118/71  Pulse- Lying: 54      Orthostatic  Sitting  BP- Sitting: 130/84  Pulse- Sitting: 76      Orthostatic Standing at 0 minutes  BP- Standing at 0 minutes: (pt did not feel comfortable standing)     Physical Exam 1205: Physical examination:  Nursing notes reviewed; Vital signs and O2 SAT reviewed;  Constitutional: Well developed, Well nourished, Well hydrated, In no acute distress; Head:  Normocephalic, atraumatic; Eyes: EOMI, PERRL, No scleral icterus; ENMT: Mouth and pharynx normal, Mucous membranes moist; Neck: Supple, Full range of motion, No lymphadenopathy; Cardiovascular: Regular rate and rhythm, No gallop; Respiratory: Breath sounds clear & equal bilaterally, No wheezes.  Speaking full sentences with ease, Normal respiratory effort/excursion; Chest: Nontender, Movement normal; Abdomen: Soft, Nontender, Nondistended, Normal bowel sounds. Rectal exam performed w/permission of pt and ED RN chaperone present.  Anal tone normal.  Non-tender, maroon stool in rectal vault, heme positive.  No fissures, no external hemorrhoids, no palp masses.;;; Genitourinary: No CVA tenderness; Extremities: Peripheral pulses normal, No tenderness, No edema, No calf edema or asymmetry.; Neuro: Awake, alert, confused per hx dementia. No facial droop. Speech clear. Pt moves all extremities on stretcher spontaneously and to command without apparent gross focal motor deficits in extremities.; Skin: Color normal, Warm, Dry.    ED Treatments / Results  Labs (all labs ordered are listed, but only abnormal results are displayed)   EKG EKG Interpretation  Date/Time:  Wednesday March 04 2019 12:19:25 EDT Ventricular Rate:  59 PR Interval:    QRS Duration: 85 QT Interval:  423 QTC Calculation: 419 R Axis:   44 Text Interpretation:  Sinus rhythm Anteroseptal infarct, old Borderline ST depression, inferior leads Baseline wander When compared with ECG of 07/17/2001 No significant change was found Confirmed by Francine Graven [phone]) on 03/04/2019 12:34:55  PM   Radiology   Procedures Procedures (including critical care time)  Medications Ordered in ED Medications - No data to display   Initial Impression / Assessment and Plan / ED Course  I have reviewed the triage vital signs and the nursing notes.  Pertinent labs & imaging results that were available during my care of the patient were reviewed by me and considered in my medical decision making (see chart for details).     MDM Reviewed: previous chart, nursing note and vitals Reviewed previous: labs and ECG Interpretation: labs, ECG, x-ray and CT scan    Results for orders placed or performed during the hospital encounter of 03/04/19  Protime-INR  Result Value  Ref Range   Prothrombin Time 12.8 11.4 - 15.2 seconds   INR 1.0 0.8 - 1.2  CBC with Differential  Result Value Ref Range   WBC 8.7 4.0 - 10.5 K/uL   RBC 5.01 3.87 - 5.11 MIL/uL   Hemoglobin 15.2 (H) 12.0 - 15.0 g/dL   HCT 45.5 36.0 - 46.0 %   MCV 90.8 80.0 - 100.0 fL   MCH 30.3 26.0 - 34.0 pg   MCHC 33.4 30.0 - 36.0 g/dL   RDW 13.1 11.5 - 15.5 %   Platelets 137 (L) 150 - 400 K/uL   nRBC 0.0 0.0 - 0.2 %   Neutrophils Relative % 84 %   Neutro Abs 7.3 1.7 - 7.7 K/uL   Lymphocytes Relative 6 %   Lymphs Abs 0.5 (L) 0.7 - 4.0 K/uL   Monocytes Relative 8 %   Monocytes Absolute 0.7 0.1 - 1.0 K/uL   Eosinophils Relative 0 %   Eosinophils Absolute 0.0 0.0 - 0.5 K/uL   Basophils Relative 1 %   Basophils Absolute 0.0 0.0 - 0.1 K/uL   Immature Granulocytes 1 %   Abs Immature Granulocytes 0.12 (H) 0.00 - 0.07 K/uL  Lactic acid, plasma  Result Value Ref Range   Lactic Acid, Venous 2.2 (HH) 0.5 - 1.9 mmol/L  Lactic acid, plasma  Result Value Ref Range   Lactic Acid, Venous 1.8 0.5 - 1.9 mmol/L  Troponin I - Once  Result Value Ref Range   Troponin I 0.04 (HH) <0.03 ng/mL  Comprehensive metabolic panel  Result Value Ref Range   Sodium 144 135 - 145 mmol/L   Potassium 3.5 3.5 - 5.1 mmol/L   Chloride 104 98 - 111  mmol/L   CO2 25 22 - 32 mmol/L   Glucose, Bld 140 (H) 70 - 99 mg/dL   BUN 36 (H) 8 - 23 mg/dL   Creatinine, Ser 1.36 (H) 0.44 - 1.00 mg/dL   Calcium 9.7 8.9 - 10.3 mg/dL   Total Protein 6.3 (L) 6.5 - 8.1 g/dL   Albumin 3.7 3.5 - 5.0 g/dL   AST 27 15 - 41 U/L   ALT 31 0 - 44 U/L   Alkaline Phosphatase 68 38 - 126 U/L   Total Bilirubin 2.2 (H) 0.3 - 1.2 mg/dL   GFR calc non Af Amer 35 (L) >60 mL/min   GFR calc Af Amer 41 (L) >60 mL/min   Anion gap 15 5 - 15  Urinalysis, Routine w reflex microscopic  Result Value Ref Range   Color, Urine AMBER (A) YELLOW   APPearance CLOUDY (A) CLEAR   Specific Gravity, Urine 1.021 1.005 - 1.030   pH 5.0 5.0 - 8.0   Glucose, UA NEGATIVE NEGATIVE mg/dL   Hgb urine dipstick NEGATIVE NEGATIVE   Bilirubin Urine NEGATIVE NEGATIVE   Ketones, ur 5 (A) NEGATIVE mg/dL   Protein, ur 30 (A) NEGATIVE mg/dL   Nitrite NEGATIVE NEGATIVE   Leukocytes,Ua NEGATIVE NEGATIVE   RBC / HPF 6-10 0 - 5 RBC/hpf   WBC, UA 6-10 0 - 5 WBC/hpf   Bacteria, UA RARE (A) NONE SEEN   Mucus PRESENT    Hyaline Casts, UA PRESENT   POC occult blood, ED  Result Value Ref Range   Fecal Occult Bld POSITIVE (A) NEGATIVE  Sample to Blood Bank  Result Value Ref Range   Blood Bank Specimen SAMPLE AVAILABLE FOR TESTING    Sample Expiration      03/07/2019,2359 Performed at [facility],  [place] [address], [place], [place] [address]    Dg Chest Port 1 View Result Date: 03/04/2019 CLINICAL DATA:  Altered mental status EXAM: PORTABLE CHEST 1 VIEW COMPARISON:  None. FINDINGS: The heart size and mediastinal contours are within normal limits. Both lungs are clear. The visualized skeletal structures are unremarkable. IMPRESSION: No active disease. Electronically Signed   By: Dorise Bullion III M.D   On: 03/04/2019 14:18    1525:  Pt unable to stand for orthostatic VS. Appears clinically dehydrated; judicious IVF given. Has had +rectal bleeding while in the ED; will need  admit. Sign out to Dr. Lita Mains.     Final Clinical Impressions(s) / ED Diagnoses   Final diagnoses:  None    ED Discharge Orders    None       Francine Graven, DO 03/04/19 1531

## 2019-03-04 NOTE — H&P (Addendum)
History and Physical    Lynn Hardy FOY:[id number] DOB: Sep 12, 1931 DOA: 03/04/2019  PCP: Merrilee Seashore, MD   Patient coming from: Home   Chief Complaint: rectal bleeding.   HPI: Lynn Hardy is a 85 y.o. female with medical history significant of dementia, atrial fibrillation, CVA and dementia who presents from home due to rectal bleeding.  She is unable to give detailed history due to cognitive impairment related to dementia.  She reported her daughter noticing blood in her clothing and bring her to the hospital.  Denies any ocular dysfunction.  Late entry: I spoke with her daughter over the phone, apparently patient had diarrhea over the last 3 days, watery nonbloody.  Today she went into her house and found blood in her bed as well as on the mattress and on the floor.  She took her into the bathroom to clean her up, where she continued to bleed while in the shower.  Patient had no apparent abdominal pain, nausea or vomiting.  Not taking aspirin for the last 6 months.   ED Course: Patient was noted to be confused and disoriented, but hemodynamically stable.  Her Hemoccult was positive.  Hemoglobin and hematocrit stable.  She has been referred for admission for further evaluation.  Review of Systems: (limited due to dementia) 1. General: No fevers, no chills, no weight gain or weight loss 2. ENT: No runny nose or sore throat, no hearing disturbances 3. Pulmonary: No dyspnea, cough, wheezing, or hemoptysis 4. Cardiovascular: No angina, claudication, lower extremity edema, pnd or orthopnea 5. Gastrointestinal: No nausea or vomiting, no diarrhea or constipation 6. Hematology: No easy bruisability or frequent infections 7. Urology: No dysuria, hematuria or increased urinary frequency 8. Dermatology: No rashes. 9. Neurology: No seizures or paresthesias 10. Musculoskeletal: No joint pain or deformities  Past Medical History:  Diagnosis Date   Asthma     Atrial fibrillation (HCC)    Cataract    NSVT (nonsustained ventricular tachycardia) (HCC)    PAT (paroxysmal atrial tachycardia) ([place])    Stroke [facility])     Past Surgical History:  Procedure Laterality Date   ABDOMINAL HYSTERECTOMY       reports that she quit smoking about 21 years ago. She has a 10.00 pack-year smoking history. She has never used smokeless tobacco. She reports that she does not drink alcohol or use drugs.  No Known Allergies  Family History  Problem Relation Age of Onset   Tuberculosis Father 20   Hypertension Mother    Heart attack Mother        Older than 42.   Alzheimer's disease Sister    Parkinson's disease Sister      Prior to Admission medications   Medication Sig Start Date End Date Taking? Authorizing Provider  acetaminophen (TYLENOL) 500 MG tablet Take 500 mg by mouth every 6 (six) hours as needed for mild pain.    Yes [provider]  B Complex-C (B-COMPLEX WITH VITAMIN C) tablet Take 1 tablet by mouth daily.   Yes [provider]  Cholecalciferol (VITAMIN D3) 1000 units CAPS Take 1,000 Units by mouth daily.    Yes [provider]  donepezil (ARICEPT) 10 MG tablet Take 10 mg by mouth at bedtime.   Yes [provider]  memantine (NAMENDA) 10 MG tablet Take 10 mg by mouth 2 (two) times daily.   Yes [provider]  Multiple Vitamins-Minerals (MULTIVITAMIN WITH MINERALS) tablet Take 1 tablet by mouth daily.   Yes [provider]  Omega-3 Fatty Acids (FISH OIL) 1000 MG CAPS Take 1,000 mg by mouth daily.    Yes [provider]  vitamin B-12 (CYANOCOBALAMIN) 100 MCG tablet Take 100 mcg by mouth daily.   Yes [provider]  vitamin E 100 UNIT capsule Take 100 Units by mouth daily.   Yes [provider]  aspirin EC 325 MG tablet Take 1 tablet (325 mg total) by mouth daily. Patient not taking: Reported on 03/04/2019 01/14/17   Melvenia Beam, MD    Physical  Exam: Vitals:   03/04/19 1139 03/04/19 1300 03/04/19 1430  BP: (!) 122/56 134/70 (!) 119/55  Pulse: 64 60 62  Resp: 18 16 19   Temp: (!) 97.5 F (36.4 C)    TempSrc: Oral    SpO2: 99% 100% 100%    Vitals:   03/04/19 1139 03/04/19 1300 03/04/19 1430  BP: (!) 122/56 134/70 (!) 119/55  Pulse: 64 60 62  Resp: 18 16 19   Temp: (!) 97.5 F (36.4 C)    TempSrc: Oral    SpO2: 99% 100% 100%   General: deconditioned.  Neurology: Awake and alert, non focal, disorientated times place and time.  Head and Neck. Head normocephalic. Neck supple with no adenopathy or thyromegaly.   E ENT: mild pallor, no icterus, oral mucosa moist Cardiovascular: No JVD. S1-S2 present, rhythmic, no gallops, rubs, or murmurs. No lower extremity edema. Pulmonary: positive breath sounds bilaterally, adequate air movement, no wheezing, rhonchi or rales. Gastrointestinal. Abdomen with no organomegaly, non tender, no rebound or guarding Skin. No rashes Musculoskeletal: no joint deformities    Labs on Admission: I have personally reviewed following labs and imaging studies  CBC: Recent Labs  Lab 03/04/19 1242  WBC 8.7  NEUTROABS 7.3  HGB 15.2*  HCT 45.5  MCV 90.8  PLT 458*   Basic Metabolic Panel: Recent Labs  Lab 03/04/19 1242  NA 144  K 3.5  CL 104  CO2 25  GLUCOSE 140*  BUN 36*  CREATININE 1.36*  CALCIUM 9.7   GFR: CrCl cannot be calculated (Unknown ideal weight.). Liver Function Tests: Recent Labs  Lab 03/04/19 1242  AST 27  ALT 31  ALKPHOS 68  BILITOT 2.2*  PROT 6.3*  ALBUMIN 3.7   No results for input(s): LIPASE, AMYLASE in the last 168 hours. No results for input(s): AMMONIA in the last 168 hours. Coagulation Profile: Recent Labs  Lab 03/04/19 1242  INR 1.0   Cardiac Enzymes: Recent Labs  Lab 03/04/19 1242  TROPONINI 0.04*   BNP (last 3 results) No results for input(s): PROBNP in the last 8760 hours. HbA1C: No results for input(s): HGBA1C in the last 72  hours. CBG: No results for input(s): GLUCAP in the last 168 hours. Lipid Profile: No results for input(s): CHOL, HDL, LDLCALC, TRIG, CHOLHDL, LDLDIRECT in the last 72 hours. Thyroid Function Tests: No results for input(s): TSH, T4TOTAL, FREET4, T3FREE, THYROIDAB in the last 72 hours. Anemia Panel: No results for input(s): VITAMINB12, FOLATE, FERRITIN, TIBC, IRON, RETICCTPCT in the last 72 hours. Urine analysis:    Component Value Date/Time   COLORURINE AMBER (A) 03/04/2019 1315   APPEARANCEUR CLOUDY (A) 03/04/2019 1315   LABSPEC 1.021 03/04/2019 1315   PHURINE 5.0 03/04/2019 1315   GLUCOSEU NEGATIVE 03/04/2019 1315   HGBUR NEGATIVE 03/04/2019 1315   BILIRUBINUR NEGATIVE 03/04/2019 1315   KETONESUR 5 (A) 03/04/2019 1315   PROTEINUR 30 (A) 03/04/2019 1315   NITRITE NEGATIVE 03/04/2019 1315   LEUKOCYTESUR NEGATIVE 03/04/2019  [place] on Admission: Ct Abdomen Pelvis Wo Contrast  Result Date: 03/04/2019 CLINICAL DATA:  Abdominal pain and diarrhea. Bloody stools. EXAM: CT ABDOMEN AND PELVIS WITHOUT CONTRAST TECHNIQUE: Multidetector CT imaging of the abdomen and pelvis was performed following the standard protocol without IV contrast. COMPARISON:  None. FINDINGS: Lower chest: Large hiatal hernia. Hepatobiliary: No focal abnormality in the liver on this study without intravenous contrast. There is no evidence for gallstones, gallbladder wall thickening, or pericholecystic fluid. No intrahepatic or extrahepatic biliary dilation. Pancreas: No focal mass lesion. No dilatation of the main duct. No intraparenchymal cyst. No peripancreatic edema. Spleen: No splenomegaly. No focal mass lesion. Adrenals/Urinary Tract: No adrenal nodule or mass. 2 mm nonobstructing stone identified upper pole right kidney. Left kidney unremarkable. No evidence for hydroureter. Bladder is markedly distended. Stomach/Bowel: Large hiatal hernia with approximately 75% of the stomach in the thorax.  Circumferential mild wall thickening in the distal stomach may be related to underdistention as there is no adjacent edema or inflammation. Duodenum is normally positioned as is the ligament of Treitz. No small bowel wall thickening. No small bowel dilatation. The terminal ileum is normal. The appendix is not visualized, but there is no edema or inflammation in the region of the cecum. No gross colonic mass. No colonic wall thickening. Diverticular changes are noted in the left colon without evidence of diverticulitis. Vascular/Lymphatic: There is abdominal aortic atherosclerosis without aneurysm. There is no gastrohepatic or hepatoduodenal ligament lymphadenopathy. No intraperitoneal or retroperitoneal lymphadenopathy. No pelvic sidewall lymphadenopathy. Reproductive: The uterus is surgically absent. There is no adnexal mass. Other: No intraperitoneal free fluid. Musculoskeletal: No worrisome lytic or sclerotic osseous abnormality. IMPRESSION: 1. No definite findings to explain the history of GI bleeding. 2. Marked distention of the urinary bladder. Dysfunction or bladder outlet obstruction should be considered. 3. Large hiatal hernia with approximately 75% of the stomach in the thorax. Mild circumferential wall thickening noted in the antral region without adjacent edema or inflammation. This may be related to underdistention, but infection/inflammation a consideration. 4. 2 mm nonobstructing right renal stone. 5. Left colonic diverticulosis without diverticulitis. 6.  Aortic Atherosclerois (ICD10-170.0) Electronically Signed   By: Misty Stanley M.D.   On: 03/04/2019 15:31   Dg Chest Port 1 View  Result Date: 03/04/2019 CLINICAL DATA:  Altered mental status EXAM: PORTABLE CHEST 1 VIEW COMPARISON:  None. FINDINGS: The heart size and mediastinal contours are within normal limits. Both lungs are clear. The visualized skeletal structures are unremarkable. IMPRESSION: No active disease. Electronically Signed   By:  Dorise Bullion III M.D   On: 03/04/2019 14:18    EKG: Independently reviewed.  89 bpm, normal axis, sinus rhythm with normal conduction, no ST segment or T wave changes, poor R wave progression.  Assessment/Plan Principal Problem:   GI bleed Active Problems:   Dementia (HCC)   Rectal bleeding   AF (paroxysmal atrial fibrillation) [facility])   Diverticulosis  85 year old female with history of dementia and paroxysmal atrial fibrillation on full dose aspirin, who presents with rectal bleeding.  Denies any gastrointestinal symptoms, denies any orthostatic symptoms.  History is limited due to cognitive impairment.  On her initial physical examination blood pressure 119/55, heart rate 62, respiratory rate 19, oxygen saturation 100%.  She has mild pallor, her lungs are clear to auscultation bilaterally, heart S1-S2 present and rhythmic, the abdomen is soft nontender, no lower extremity edema.  Sodium 134, potassium 3.5, chloride 104, bicarb 25, glucose 140, BUN 36, creatinine  1.36, AST 27, ALT 31, troponin I 0.04, white count 8.7, hemoglobin 15.2, hematocrit 45.5, platelets 137, urinalysis negative for infection, 6-10 red cells, 6-10 white cells.  Hemoccult test positive.  CT of the abdomen with no acute findings, marked distention of the urinary bladder.  Large hiatal hernia.  Left colonic diverticulosis without diverticulitis.  Her chest x-ray had hyperinflation but no infiltrates.   Patient will be admitted to the hospital working diagnosis of rectal bleeding.  1.  Rectal bleeding due to diverticulosis in the setting of anticoagulation with full dose aspirin.  Patient is hemodynamically stable, her orthostatic vital signs were negative in the emergency department.  Patient will be admitted to the medical ward, she will be placed on a remote telemetry monitor, will check hemoglobin and hematocrit in the morning.  Monitor for any signs of worsening bleeding.  Discontinue full dose aspirin. Add daily  pantoprazole.   2.  Paroxysmal atrial fibrillation.  Patient currently in sinus rhythm, continue telemetry monitoring, discontinue anticoagulation due to bleeding.  3.  Dementia.  Patient is confused and disoriented times place and time.  Continue donepezil and memantine.  4.  Chronic kidney disease 3b.  Serum creatinine 1.36, electrolytes within normal ranges.  Avoid hypotension nephrotoxic agents  DVT prophylaxis:  scd  Code Status: full  Family Communication: no family at the bedside   Disposition Plan: telemetry   Consults called:  None   Admission status: Observation.     Ataya Murdy Gerome Apley MD [facility]   03/04/2019, 4:06 PM

## 2019-03-04 NOTE — ED Notes (Signed)
Patient transported to CT 

## 2019-03-04 NOTE — ED Notes (Signed)
Bed: WA21 Expected date:  Expected time:  Means of arrival:  Comments: EMS 85 yo female bloody stool

## 2019-03-04 NOTE — ED Triage Notes (Signed)
Pt BIB EMS from home. Pt started complaining of abdominal pain and diarrhea on Friday. Pts daughter went to check on her today and noticed blood in her bed. Pts daughter took her to the bathroom and noticed dark red stool. Pt has hx of dementia. A&O x3.   BP 116/70 HR 82 RR 20 98%

## 2019-03-05 DIAGNOSIS — N390 Urinary tract infection, site not specified: Secondary | ICD-10-CM | POA: Diagnosis not present

## 2019-03-05 DIAGNOSIS — Z8673 Personal history of transient ischemic attack (TIA), and cerebral infarction without residual deficits: Secondary | ICD-10-CM | POA: Diagnosis not present

## 2019-03-05 DIAGNOSIS — N183 Chronic kidney disease, stage 3 (moderate): Secondary | ICD-10-CM | POA: Diagnosis present

## 2019-03-05 DIAGNOSIS — J9601 Acute respiratory failure with hypoxia: Secondary | ICD-10-CM | POA: Diagnosis not present

## 2019-03-05 DIAGNOSIS — E669 Obesity, unspecified: Secondary | ICD-10-CM | POA: Diagnosis present

## 2019-03-05 DIAGNOSIS — Z515 Encounter for palliative care: Secondary | ICD-10-CM | POA: Diagnosis present

## 2019-03-05 DIAGNOSIS — J188 Other pneumonia, unspecified organism: Secondary | ICD-10-CM | POA: Diagnosis not present

## 2019-03-05 DIAGNOSIS — R140 Abdominal distension (gaseous): Secondary | ICD-10-CM | POA: Diagnosis not present

## 2019-03-05 DIAGNOSIS — T17908A Unspecified foreign body in respiratory tract, part unspecified causing other injury, initial encounter: Secondary | ICD-10-CM | POA: Diagnosis not present

## 2019-03-05 DIAGNOSIS — A419 Sepsis, unspecified organism: Secondary | ICD-10-CM | POA: Diagnosis not present

## 2019-03-05 DIAGNOSIS — N179 Acute kidney failure, unspecified: Secondary | ICD-10-CM | POA: Diagnosis not present

## 2019-03-05 DIAGNOSIS — Y846 Urinary catheterization as the cause of abnormal reaction of the patient, or of later complication, without mention of misadventure at the time of the procedure: Secondary | ICD-10-CM | POA: Diagnosis present

## 2019-03-05 DIAGNOSIS — K668 Other specified disorders of peritoneum: Secondary | ICD-10-CM | POA: Diagnosis not present

## 2019-03-05 DIAGNOSIS — I69318 Other symptoms and signs involving cognitive functions following cerebral infarction: Secondary | ICD-10-CM | POA: Diagnosis not present

## 2019-03-05 DIAGNOSIS — N189 Chronic kidney disease, unspecified: Secondary | ICD-10-CM | POA: Diagnosis not present

## 2019-03-05 DIAGNOSIS — R1310 Dysphagia, unspecified: Secondary | ICD-10-CM | POA: Diagnosis not present

## 2019-03-05 DIAGNOSIS — K922 Gastrointestinal hemorrhage, unspecified: Secondary | ICD-10-CM | POA: Diagnosis present

## 2019-03-05 DIAGNOSIS — J690 Pneumonitis due to inhalation of food and vomit: Secondary | ICD-10-CM | POA: Diagnosis not present

## 2019-03-05 DIAGNOSIS — K5731 Diverticulosis of large intestine without perforation or abscess with bleeding: Secondary | ICD-10-CM | POA: Diagnosis not present

## 2019-03-05 DIAGNOSIS — K625 Hemorrhage of anus and rectum: Secondary | ICD-10-CM | POA: Diagnosis not present

## 2019-03-05 DIAGNOSIS — R05 Cough: Secondary | ICD-10-CM | POA: Diagnosis not present

## 2019-03-05 DIAGNOSIS — K5791 Diverticulosis of intestine, part unspecified, without perforation or abscess with bleeding: Secondary | ICD-10-CM | POA: Diagnosis not present

## 2019-03-05 DIAGNOSIS — I4891 Unspecified atrial fibrillation: Secondary | ICD-10-CM | POA: Diagnosis not present

## 2019-03-05 DIAGNOSIS — L98498 Non-pressure chronic ulcer of skin of other sites with other specified severity: Secondary | ICD-10-CM | POA: Diagnosis not present

## 2019-03-05 DIAGNOSIS — Z7189 Other specified counseling: Secondary | ICD-10-CM | POA: Diagnosis not present

## 2019-03-05 DIAGNOSIS — Z8719 Personal history of other diseases of the digestive system: Secondary | ICD-10-CM | POA: Diagnosis not present

## 2019-03-05 DIAGNOSIS — T17908S Unspecified foreign body in respiratory tract, part unspecified causing other injury, sequela: Secondary | ICD-10-CM | POA: Diagnosis not present

## 2019-03-05 DIAGNOSIS — F0280 Dementia in other diseases classified elsewhere without behavioral disturbance: Secondary | ICD-10-CM | POA: Diagnosis not present

## 2019-03-05 DIAGNOSIS — R0602 Shortness of breath: Secondary | ICD-10-CM | POA: Diagnosis not present

## 2019-03-05 DIAGNOSIS — F0390 Unspecified dementia without behavioral disturbance: Secondary | ICD-10-CM | POA: Diagnosis present

## 2019-03-05 DIAGNOSIS — G309 Alzheimer's disease, unspecified: Secondary | ICD-10-CM | POA: Diagnosis not present

## 2019-03-05 DIAGNOSIS — T17320A Food in larynx causing asphyxiation, initial encounter: Secondary | ICD-10-CM | POA: Diagnosis not present

## 2019-03-05 DIAGNOSIS — R34 Anuria and oliguria: Secondary | ICD-10-CM | POA: Diagnosis present

## 2019-03-05 DIAGNOSIS — Z6832 Body mass index (BMI) 32.0-32.9, adult: Secondary | ICD-10-CM | POA: Diagnosis not present

## 2019-03-05 DIAGNOSIS — Z66 Do not resuscitate: Secondary | ICD-10-CM | POA: Diagnosis present

## 2019-03-05 DIAGNOSIS — R451 Restlessness and agitation: Secondary | ICD-10-CM | POA: Diagnosis not present

## 2019-03-05 DIAGNOSIS — T83518A Infection and inflammatory reaction due to other urinary catheter, initial encounter: Secondary | ICD-10-CM | POA: Diagnosis not present

## 2019-03-05 DIAGNOSIS — E872 Acidosis: Secondary | ICD-10-CM | POA: Diagnosis not present

## 2019-03-05 DIAGNOSIS — K5790 Diverticulosis of intestine, part unspecified, without perforation or abscess without bleeding: Secondary | ICD-10-CM | POA: Diagnosis not present

## 2019-03-05 DIAGNOSIS — Z1159 Encounter for screening for other viral diseases: Secondary | ICD-10-CM | POA: Diagnosis not present

## 2019-03-05 DIAGNOSIS — K649 Unspecified hemorrhoids: Secondary | ICD-10-CM | POA: Diagnosis present

## 2019-03-05 DIAGNOSIS — E870 Hyperosmolality and hypernatremia: Secondary | ICD-10-CM | POA: Diagnosis not present

## 2019-03-05 DIAGNOSIS — I480 Paroxysmal atrial fibrillation: Secondary | ICD-10-CM | POA: Diagnosis present

## 2019-03-05 DIAGNOSIS — D72829 Elevated white blood cell count, unspecified: Secondary | ICD-10-CM | POA: Diagnosis not present

## 2019-03-05 DIAGNOSIS — J9602 Acute respiratory failure with hypercapnia: Secondary | ICD-10-CM | POA: Diagnosis not present

## 2019-03-05 LAB — BASIC METABOLIC PANEL
Anion gap: 14 (ref 5–15)
BUN: 31 mg/dL — ABNORMAL HIGH (ref 8–23)
CO2: 22 mmol/L (ref 22–32)
Calcium: 8.9 mg/dL (ref 8.9–10.3)
Chloride: 110 mmol/L (ref 98–111)
Creatinine, Ser: 1.1 mg/dL — ABNORMAL HIGH (ref 0.44–1.00)
GFR calc Af Amer: 53 mL/min — ABNORMAL LOW (ref >60)
GFR calc non Af Amer: 46 mL/min — ABNORMAL LOW (ref >60)
Glucose, Bld: 92 mg/dL (ref 70–99)
Potassium: 2.8 mmol/L — ABNORMAL LOW (ref 3.5–5.1)
Sodium: 146 mmol/L — ABNORMAL HIGH (ref 135–145)

## 2019-03-05 LAB — CBC
HCT: 40.1 % (ref 36.0–46.0)
Hemoglobin: 13.4 g/dL (ref 12.0–15.0)
MCH: 30.6 pg (ref 26.0–34.0)
MCHC: 33.4 g/dL (ref 30.0–36.0)
MCV: 91.6 fL (ref 80.0–100.0)
Platelets: 109 10*3/uL — ABNORMAL LOW (ref 150–400)
RBC: 4.38 MIL/uL (ref 3.87–5.11)
RDW: 13.2 % (ref 11.5–15.5)
WBC: 6.1 10*3/uL (ref 4.0–10.5)
nRBC: 0 % (ref 0.0–0.2)

## 2019-03-05 LAB — URINE CULTURE: Culture: NO GROWTH

## 2019-03-05 MED ORDER — NYSTATIN 100000 UNIT/GM EX CREA
TOPICAL_CREAM | Freq: Two times a day (BID) | CUTANEOUS | Status: DC
  Administered 2019-03-05 – 2019-03-07 (×4): via TOPICAL
  Administered 2019-03-08: 1 via TOPICAL
  Administered 2019-03-08 – 2019-03-10 (×4): via TOPICAL
  Administered 2019-03-10: 1 via TOPICAL
  Administered 2019-03-11 – 2019-03-16 (×12): via TOPICAL
  Administered 2019-03-17: 1 via TOPICAL
  Administered 2019-03-17 – 2019-03-18 (×2): via TOPICAL
  Filled 2019-03-05 (×2): qty 15

## 2019-03-05 MED ORDER — HYDROCORTISONE ACETATE 25 MG RE SUPP
25.0000 mg | Freq: Two times a day (BID) | RECTAL | Status: DC
  Administered 2019-03-05 – 2019-03-18 (×25): 25 mg via RECTAL
  Filled 2019-03-05 (×29): qty 1

## 2019-03-05 MED ORDER — HYDROCORTISONE ACETATE 25 MG RE SUPP: 25.0000 mg | Freq: Two times a day (BID) | RECTAL | Status: DC

## 2019-03-05 MED ORDER — POTASSIUM CHLORIDE CRYS ER 20 MEQ PO TBCR
40.0000 meq | EXTENDED_RELEASE_TABLET | ORAL | Status: AC
  Administered 2019-03-05: 40 meq via ORAL
  Filled 2019-03-05: qty 2

## 2019-03-05 MED ORDER — NYSTATIN 100000 UNIT/GM EX CREA: TOPICAL_CREAM | Freq: Two times a day (BID) | CUTANEOUS | Status: DC

## 2019-03-05 NOTE — Progress Notes (Signed)
Bladder scan with 138 cc. Will continue to monitor.

## 2019-03-05 NOTE — Progress Notes (Signed)
CRITICAL VALUE ALERT  Critical Value: k 2.8  Date & Time Notied: 6/18 0656  Provider Notified y  Orders Received/Actions taken: pending

## 2019-03-05 NOTE — Progress Notes (Signed)
Patient c/o inability to void.  Bladder scan 1800cc.  Int cath for 1800cc foul smelling dark amber urine at 0140

## 2019-03-05 NOTE — Progress Notes (Signed)
PROGRESS NOTE    Lynn Hardy  GYI:[id number] DOB: Oct 26, 1930 DOA: 03/04/2019 PCP: Merrilee Seashore, MD    Brief Narrative:  85 year old female with history of dementia and paroxysmal atrial fibrillation on full dose aspirin, who presents with rectal bleeding.  Denies any gastrointestinal symptoms, denies any orthostatic symptoms.  History is limited due to cognitive impairment.  On her initial physical examination blood pressure 119/55, heart rate 62, respiratory rate 19, oxygen saturation 100%.  She has mild pallor, her lungs are clear to auscultation bilaterally, heart S1-S2 present and rhythmic, the abdomen is soft nontender, no lower extremity edema.  Sodium 134, potassium 3.5, chloride 104, bicarb 25, glucose 140, BUN 36, creatinine 1.36, AST 27, ALT 31, troponin I 0.04, white count 8.7, hemoglobin 15.2, hematocrit 45.5, platelets 137, urinalysis negative for infection, 6-10 red cells, 6-10 white cells.  Hemoccult test positive.  CT of the abdomen with no acute findings, marked distention of the urinary bladder.  Large hiatal hernia.  Left colonic diverticulosis without diverticulitis.  Her chest x-ray had hyperinflation but no infiltrates.   Patient will be admitted to the hospital working diagnosis of rectal bleeding.   Assessment & Plan:   Principal Problem:   GI bleed Active Problems:   Dementia (HCC)   Rectal bleeding   AF (paroxysmal atrial fibrillation) (HCC)   Diverticulosis   1.  Rectal bleeding due to diverticulosis in the setting of anticoagulation with full dose aspirin.  Patient ha a drop in Hgb down to 13 from 15, but no further macroscopic bleeding. She continue to be very weak and deconditioned, this am her blood pressure down to 105 over 52, and her pulse rate has been 142 to 57. Will continue H&H monitoring, patient will need to continue inpatient management. Continue antiacid therapy for now. Per her daughter she has been not taking asa at home.    2.  Paroxysmal atrial fibrillation.  episodic tachycardia and bradycardia, possible sick sinus syndrome, will continue telemetry monitor, keep K at 4 and Mg at 2. Patient not on AV blockade, per family she has been not taking aspirin at home. Physical therapy evaluation pending.   3.  Dementia.  Advance disease, with persistent confusion, will continue donepezil and memantine. Had agitation last night, and received lorazepam.   4.  AKI on chronic kidney disease 3b with hypokalemia.  renal function with serum cr at 1,10, K today at 2.8, will continue K correction with Kcl 80 meq in 2 divided doses, check Mg in am,.   DVT prophylaxis: scd   Code Status: full Family Communication: no family at the bedside  Disposition Plan/ discharge barriers: pending clinical improvement   There is no height or weight on file to calculate BMI. Malnutrition Type:      Malnutrition Characteristics:      Nutrition Interventions:     RN Pressure Injury Documentation:     Consultants:     Procedures:     Antimicrobials:       Subjective: Patient continue to feel very weak and decondition, no nausea or vomiting, no further rectal bleeding.   Objective: Vitals:   03/04/19 1736 03/04/19 2004 03/04/19 2330 03/05/19 0502  BP: (!) 129/91 (!) 135/115 (!) 141/55 (!) 105/52  Pulse: 62 65 72 (!) 57  Resp:  16 18 20   Temp: 98 F (36.7 C) 98.3 F (36.8 C)  98 F (36.7 C)  TempSrc: Oral   Oral  SpO2: 100% 99% 99% 97%    Intake/Output Summary (Last  24 hours) at 03/05/2019 1006 Last data filed at 03/05/2019 0142 Gross per 24 hour  Intake 0 ml  Output 1800 ml  Net -1800 ml   There were no vitals filed for this visit.  Examination:   General: deconditioned and ill looking appearing  Neurology: Awake and alert, non focal, confused and disorientated.  E ENT: no pallor, no icterus, oral mucosa moist Cardiovascular: No JVD. S1-S2 present, rhythmic, no gallops, rubs, or murmurs. Trace  lower extremity edema. Pulmonary: positive breath sounds bilaterally, adequate air movement, no wheezing, rhonchi or rales. Gastrointestinal. Abdomen mild distended with no organomegaly, non tender, no rebound or guarding Skin. No rashes Musculoskeletal: no joint deformities     Data Reviewed: I have personally reviewed following labs and imaging studies  CBC: Recent Labs  Lab 03/04/19 1242 03/05/19 0540  WBC 8.7 6.1  NEUTROABS 7.3  --   HGB 15.2* 13.4  HCT 45.5 40.1  MCV 90.8 91.6  PLT 137* 528*   Basic Metabolic Panel: Recent Labs  Lab 03/04/19 1242 03/05/19 0540  NA 144 146*  K 3.5 2.8*  CL 104 110  CO2 25 22  GLUCOSE 140* 92  BUN 36* 31*  CREATININE 1.36* 1.10*  CALCIUM 9.7 8.9   GFR: CrCl cannot be calculated (Unknown ideal weight.). Liver Function Tests: Recent Labs  Lab 03/04/19 1242  AST 27  ALT 31  ALKPHOS 68  BILITOT 2.2*  PROT 6.3*  ALBUMIN 3.7   No results for input(s): LIPASE, AMYLASE in the last 168 hours. No results for input(s): AMMONIA in the last 168 hours. Coagulation Profile: Recent Labs  Lab 03/04/19 1242  INR 1.0   Cardiac Enzymes: Recent Labs  Lab 03/04/19 1242  TROPONINI 0.04*   BNP (last 3 results) No results for input(s): PROBNP in the last 8760 hours. HbA1C: No results for input(s): HGBA1C in the last 72 hours. CBG: No results for input(s): GLUCAP in the last 168 hours. Lipid Profile: No results for input(s): CHOL, HDL, LDLCALC, TRIG, CHOLHDL, LDLDIRECT in the last 72 hours. Thyroid Function Tests: No results for input(s): TSH, T4TOTAL, FREET4, T3FREE, THYROIDAB in the last 72 hours. Anemia Panel: No results for input(s): VITAMINB12, FOLATE, FERRITIN, TIBC, IRON, RETICCTPCT in the last 72 hours.    Radiology Studies: I have reviewed all of the imaging during this hospital visit personally     Scheduled Meds: . B-complex with vitamin C  1 tablet Oral Daily  . cholecalciferol  1,000 Units Oral Daily  .  donepezil  10 mg Oral QHS  . memantine  10 mg Oral BID  . multivitamin with minerals  1 tablet Oral Daily  . pantoprazole  40 mg Oral Daily  . potassium chloride  40 mEq Oral Q4H  . vitamin B-12  100 mcg Oral Daily   Continuous Infusions:   LOS: 1 day         Gerome Apley, MD

## 2019-03-05 NOTE — Progress Notes (Signed)
PT Cancellation Note  Patient Details Name: Lynn Hardy MRN: [id number] DOB: Mar 11, 1931   Cancelled Treatment:     PT order received. Pt has  low K+ 2.8 this morning. PT is contraindicated at this time. Will follow pt and evaluate mobility when K+ is trending towards normal.   Lelon Mast 03/05/2019, 11:56 AM

## 2019-03-06 LAB — CBC WITH DIFFERENTIAL/PLATELET
Abs Immature Granulocytes: 0.2 10*3/uL — ABNORMAL HIGH (ref 0.00–0.07)
Basophils Absolute: 0 10*3/uL (ref 0.0–0.1)
Basophils Relative: 1 %
Eosinophils Absolute: 0.1 10*3/uL (ref 0.0–0.5)
Eosinophils Relative: 1 %
HCT: 41.4 % (ref 36.0–46.0)
Hemoglobin: 13.7 g/dL (ref 12.0–15.0)
Immature Granulocytes: 4 %
Lymphocytes Relative: 17 %
Lymphs Abs: 0.9 10*3/uL (ref 0.7–4.0)
MCH: 30.6 pg (ref 26.0–34.0)
MCHC: 33.1 g/dL (ref 30.0–36.0)
MCV: 92.4 fL (ref 80.0–100.0)
Monocytes Absolute: 0.5 10*3/uL (ref 0.1–1.0)
Monocytes Relative: 10 %
Neutro Abs: 3.4 10*3/uL (ref 1.7–7.7)
Neutrophils Relative %: 67 %
Platelets: 107 10*3/uL — ABNORMAL LOW (ref 150–400)
RBC: 4.48 MIL/uL (ref 3.87–5.11)
RDW: 13.2 % (ref 11.5–15.5)
WBC: 5.1 10*3/uL (ref 4.0–10.5)
nRBC: 0 % (ref 0.0–0.2)

## 2019-03-06 LAB — BASIC METABOLIC PANEL
Anion gap: 13 (ref 5–15)
BUN: 27 mg/dL — ABNORMAL HIGH (ref 8–23)
CO2: 22 mmol/L (ref 22–32)
Calcium: 8.8 mg/dL — ABNORMAL LOW (ref 8.9–10.3)
Chloride: 112 mmol/L — ABNORMAL HIGH (ref 98–111)
Creatinine, Ser: 1.24 mg/dL — ABNORMAL HIGH (ref 0.44–1.00)
GFR calc Af Amer: 46 mL/min — ABNORMAL LOW (ref >60)
GFR calc non Af Amer: 40 mL/min — ABNORMAL LOW (ref >60)
Glucose, Bld: 83 mg/dL (ref 70–99)
Potassium: 3.4 mmol/L — ABNORMAL LOW (ref 3.5–5.1)
Sodium: 147 mmol/L — ABNORMAL HIGH (ref 135–145)

## 2019-03-06 MED ORDER — POTASSIUM CHLORIDE CRYS ER 20 MEQ PO TBCR
40.0000 meq | EXTENDED_RELEASE_TABLET | Freq: Once | ORAL | Status: AC
  Administered 2019-03-06: 40 meq via ORAL
  Filled 2019-03-06: qty 2

## 2019-03-06 NOTE — NC FL2 (Signed)
[place] LEVEL OF CARE SCREENING TOOL     IDENTIFICATION  Patient Name: Lynn Hardy Birthdate: 03/11/1931 Sex: female Admission Date (Current Location): 03/04/2019  [facility] and [place] Number:  Herbalist and Address:  [facility],  [address] [place], [place]      Provider Number: [id number]  Attending Physician Name and Address:  Tawni Millers  Relative Name and Phone Number:       Current Level of Care: Hospital Recommended Level of Care: [place] Prior Approval Number:    Date Approved/Denied:   PASRR Number:   [id number]   Discharge Plan: SNF    Current Diagnoses: Patient Active Problem List   Diagnosis Date Noted  . GI bleed 03/04/2019  . Rectal bleeding 03/04/2019  . AF (paroxysmal atrial fibrillation) ([place]) 03/04/2019  . Diverticulosis 03/04/2019  . Dementia ([place]) 10/16/2016  . Morbid obesity ([place]) 04/29/2016  . Ankle edema 05/08/2013  . HTN (hypertension) 05/08/2013  . NSVT (nonsustained ventricular tachycardia) ([place]) 05/08/2013  . Ectopic atrial tachycardia ([place]) 05/08/2013    Orientation RESPIRATION BLADDER Height & Weight     Self  Normal Continent Weight: 176 lb 9.4 oz (80.1 kg) Height:  5' 2.01" (157.5 cm)  BEHAVIORAL SYMPTOMS/MOOD NEUROLOGICAL BOWEL NUTRITION STATUS      Continent Diet(regular)  AMBULATORY STATUS COMMUNICATION OF NEEDS Skin   Limited Assist Verbally Normal                       Personal Care Assistance Level of Assistance  Bathing, Dressing, Feeding Bathing Assistance: Limited assistance Feeding assistance: Independent Dressing Assistance: Limited assistance     Functional Limitations Info             SPECIAL CARE FACTORS FREQUENCY  PT (By licensed PT), OT (By licensed OT)     PT Frequency: 5 OT Frequency: 5            Contractures      Additional Factors Info  Code Status, Allergies Code Status Info: Full  code Allergies Info: nka           Current Medications (03/06/2019):  This is the current hospital active medication list Current Facility-Administered Medications  Medication Dose Route Frequency Provider Last Rate Last Dose  . acetaminophen (TYLENOL) tablet 650 mg  650 mg Oral Q6H PRN Arrien, Jimmy Picket, MD       Or  . acetaminophen (TYLENOL) suppository 650 mg  650 mg Rectal Q6H PRN Arrien, Jimmy Picket, MD      . B-complex with vitamin C tablet 1 tablet  1 tablet Oral Daily Arrien, Jimmy Picket, MD   1 tablet at 03/06/19 0904  . cholecalciferol (VITAMIN D) tablet 1,000 Units  1,000 Units Oral Daily Tawni Millers, MD   1,000 Units at 03/06/19 0904  . donepezil (ARICEPT) tablet 10 mg  10 mg Oral QHS Arrien, Jimmy Picket, MD   10 mg at 03/05/19 2123  . hydrocortisone (ANUSOL-HC) suppository 25 mg  25 mg Rectal BID Lenis Noon, RPH   25 mg at 03/06/19 4782  . memantine (NAMENDA) tablet 10 mg  10 mg Oral BID Tawni Millers, MD   10 mg at 03/06/19 0904  . multivitamin with minerals tablet 1 tablet  1 tablet Oral Daily Arrien, Jimmy Picket, MD   1 tablet at 03/06/19 0904  . nystatin cream (MYCOSTATIN)   Topical BID Arrien, Jimmy Picket, MD      .  ondansetron (ZOFRAN) tablet 4 mg  4 mg Oral Q6H PRN Arrien, Jimmy Picket, MD       Or  . ondansetron [facility]) injection 4 mg  4 mg Intravenous Q6H PRN Arrien, Jimmy Picket, MD      . pantoprazole (PROTONIX) EC tablet 40 mg  40 mg Oral Daily Tawni Millers, MD   40 mg at 03/06/19 0904  . vitamin B-12 (CYANOCOBALAMIN) tablet 100 mcg  100 mcg Oral Daily Arrien, Jimmy Picket, MD   100 mcg at 03/06/19 4383     Discharge Medications: Please see discharge summary for a list of discharge medications.  Relevant Imaging Results:  Relevant Lab Results:   Additional Information [id number]  Weston Anna, LCSW

## 2019-03-06 NOTE — Progress Notes (Signed)
Bladder scan at 1400, 173ml urine. Patient was incontinent.

## 2019-03-06 NOTE — Progress Notes (Signed)
No void since 1600.  Straight cath at 0200 225cc

## 2019-03-06 NOTE — Progress Notes (Signed)
PROGRESS NOTE    Lynn Hardy  HCW:[id number] DOB: June 14, 1931 DOA: 03/04/2019 PCP: Merrilee Seashore, MD    Brief Narrative:  85 year old female with history of dementia and paroxysmal atrial fibrillation on full dose aspirin,who presents with rectal bleeding. Denies any gastrointestinal symptoms, denies any orthostatic symptoms. History is limited due to cognitive impairment. On her initial physical examination blood pressure 119/55, heart rate 62, respiratory rate 19, oxygen saturation 100%. She has mild pallor, her lungs are clear to auscultation bilaterally, heart S1-S2 present and rhythmic, the abdomen is soft nontender, no lower extremity edema. Sodium 134, potassium 3.5, chloride 104, bicarb 25, glucose 140, BUN 36, creatinine 1.36, AST 27, ALT 31, troponin I 0.04, white count 8.7, hemoglobin 15.2, hematocrit 45.5, platelets 137,urinalysis negative for infection, 6-10 red cells, 6-10 white cells. Hemoccult test positive.CT of the abdomen with no acute findings, markeddistention of the urinary bladder. Large hiatal hernia. Left colonic diverticulosis without diverticulitis. Her chest x-ray had hyperinflation but no infiltrates.  Patient will be admitted to the hospital working diagnosis of rectal bleeding.   Assessment & Plan:   Principal Problem:   GI bleed Active Problems:   Dementia (HCC)   Rectal bleeding   AF (paroxysmal atrial fibrillation) (HCC)   Diverticulosis   1.Rectal bleeding due to diverticulosis in the setting of anticoagulation with full dose aspirin. Her hgb and Hct have remained stable over the last 24 H at 13,7 and 41.4. No signs of recurrent bleeding. Holding aspiring, patient tolerating po well. She has persistent weakness and deconditioning, will need snf at discharge.   2.Paroxysmal atrial fibrillation.HR 69 bmp, will continue close monitoring with vital signs, will dc telemetry for now. Out of bed as tolerated with physical  therapy.   3.Dementia.persistent confusion and disorientation, continue neuro checks per unit protocol. Will need snf at discharge. Continue dobepezil and multivitamins.   4.AKI on chronic kidney disease 3b with hypokalemia.K up to 3,4 with serum cr at 1,24, patient pulled her IV yesterday, will continue k correction with Kcl, continue to encourage po intake, and will follow renal panel in am.   5, Obesity. Calculated BMI is 32.   DVT prophylaxis: scd   Code Status: full Family Communication: no family at the bedside  Disposition Plan/ discharge barriers: pending clinical improvement   Body mass index is 32.29 kg/m. Malnutrition Type:      Malnutrition Characteristics:      Nutrition Interventions:     RN Pressure Injury Documentation:     Consultants:     Procedures:     Antimicrobials:       Subjective: Patient continue to be confused and disorientated, not agitated, continue to be very weak and deconditioned, this am out of bed to chair.   Objective: Vitals:   03/05/19 1259 03/05/19 2212 03/06/19 0559 03/06/19 0723  BP: 135/64 (!) 141/58 132/64 132/64  Pulse: 61 66 70 70  Resp:  20 18 18   Temp: 97.7 F (36.5 C) 98.8 F (37.1 C) 98.4 F (36.9 C) 98.4 F (36.9 C)  TempSrc: Oral Oral Oral Oral  SpO2: 96% 96% 97%   Weight:    80.1 kg  Height:    5' 2.01" (1.575 m)    Intake/Output Summary (Last 24 hours) at 03/06/2019 1145 Last data filed at 03/06/2019 0900 Gross per 24 hour  Intake 240 ml  Output 850 ml  Net -610 ml   Filed Weights   03/06/19 0723  Weight: 80.1 kg    Examination:   General:  deconditioned  Neurology: Awake and alert, non focal  E ENT: mild pallor, no icterus, oral mucosa moist Cardiovascular: No JVD. S1-S2 present, rhythmic, no gallops, rubs, or murmurs. No lower extremity edema. Pulmonary: positive breath sounds bilaterally, adequate air movement, no wheezing, rhonchi or rales. Gastrointestinal. Abdomen  with no organomegaly, non tender, no rebound or guarding Skin. No rashes Musculoskeletal: no joint deformities     Data Reviewed: I have personally reviewed following labs and imaging studies  CBC: Recent Labs  Lab 03/04/19 1242 03/05/19 0540 03/06/19 0523  WBC 8.7 6.1 5.1  NEUTROABS 7.3  --  3.4  HGB 15.2* 13.4 13.7  HCT 45.5 40.1 41.4  MCV 90.8 91.6 92.4  PLT 137* 109* 161*   Basic Metabolic Panel: Recent Labs  Lab 03/04/19 1242 03/05/19 0540 03/06/19 0523  NA 144 146* 147*  K 3.5 2.8* 3.4*  CL 104 110 112*  CO2 25 22 22   GLUCOSE 140* 92 83  BUN 36* 31* 27*  CREATININE 1.36* 1.10* 1.24*  CALCIUM 9.7 8.9 8.8*   GFR: Estimated Creatinine Clearance: 32.5 mL/min (A) (by C-G formula based on SCr of 1.24 mg/dL (H)). Liver Function Tests: Recent Labs  Lab 03/04/19 1242  AST 27  ALT 31  ALKPHOS 68  BILITOT 2.2*  PROT 6.3*  ALBUMIN 3.7   No results for input(s): LIPASE, AMYLASE in the last 168 hours. No results for input(s): AMMONIA in the last 168 hours. Coagulation Profile: Recent Labs  Lab 03/04/19 1242  INR 1.0   Cardiac Enzymes: Recent Labs  Lab 03/04/19 1242  TROPONINI 0.04*   BNP (last 3 results) No results for input(s): PROBNP in the last 8760 hours. HbA1C: No results for input(s): HGBA1C in the last 72 hours. CBG: No results for input(s): GLUCAP in the last 168 hours. Lipid Profile: No results for input(s): CHOL, HDL, LDLCALC, TRIG, CHOLHDL, LDLDIRECT in the last 72 hours. Thyroid Function Tests: No results for input(s): TSH, T4TOTAL, FREET4, T3FREE, THYROIDAB in the last 72 hours. Anemia Panel: No results for input(s): VITAMINB12, FOLATE, FERRITIN, TIBC, IRON, RETICCTPCT in the last 72 hours.    Radiology Studies: I have reviewed all of the imaging during this hospital visit personally     Scheduled Meds: . B-complex with vitamin C  1 tablet Oral Daily  . cholecalciferol  1,000 Units Oral Daily  . donepezil  10 mg Oral QHS  .  hydrocortisone  25 mg Rectal BID  . memantine  10 mg Oral BID  . multivitamin with minerals  1 tablet Oral Daily  . nystatin cream   Topical BID  . pantoprazole  40 mg Oral Daily  . vitamin B-12  100 mcg Oral Daily   Continuous Infusions:   LOS: 2 days        Mauricio Gerome Apley, MD

## 2019-03-06 NOTE — Evaluation (Addendum)
Physical Therapy Evaluation Patient Details Name: Lynn Hardy MRN: [id number] DOB: May 01, 1931 Today's Date: 03/06/2019   History of Present Illness  85 year old female with history of dementia and paroxysmal atrial fibrillation on full dose aspirin, who presents with rectal bleeding  Clinical Impression  Pt admitted with above diagnosis. Pt currently with functional limitations due to the deficits listed below (see PT Problem List). Pt ambulated 103' with RW, distance limited by fatigue. Pt would benefit from 24* supervision due to confusion (pt is oriented to self only, cannot state month/year nor location). Spoke to pt's daughter by phone who stated she stays with pt during the day, but pt is alone at night. Daughter is agreeable to SNF.  Pt will benefit from skilled PT to increase their independence and safety with mobility to allow discharge to the venue listed below.       Follow Up Recommendations SNF vs HHPT, depending on progress; Supervision for mobility/OOB;Supervision/Assistance - 24 hour    Equipment Recommendations  Rolling walker with 5" wheels    Recommendations for Other Services       Precautions / Restrictions Precautions Precautions: Fall Precaution Comments: daughter reports pt had loss of balance 1 month ago, pt caught herself on a chair Restrictions Weight Bearing Restrictions: No      Mobility  Bed Mobility Overal bed mobility: Needs Assistance Bed Mobility: Supine to Sit     Supine to sit: Min assist     General bed mobility comments: assist to raise trunk and pivot hips to edge of bed  Transfers Overall transfer level: Needs assistance Equipment used: Rolling walker (2 wheeled) Transfers: Sit to/from Stand Sit to Stand: Min assist         General transfer comment: assist to rise/steady  Ambulation/Gait Ambulation/Gait assistance: Min guard Gait Distance (Feet): 70 Feet Assistive device: Rolling walker (2 wheeled) Gait  Pattern/deviations: Step-through pattern Gait velocity: WFL   General Gait Details: distance limited by fatigue  Stairs            Wheelchair Mobility    Modified Rankin (Stroke Patients Only)       Balance Overall balance assessment: Needs assistance;History of Falls Sitting-balance support: Feet supported;Single extremity supported Sitting balance-[name]: Good       Standing balance-[name]: Fair                               Pertinent Vitals/Pain Pain Assessment: No/denies pain    Home Living Family/patient expects to be discharged to:: Private residence Living Arrangements: Children Available Help at Discharge: Family;Available PRN/intermittently(daughter stays with pt during the day, pt is alone at night) Type of Home: House Home Access: Stairs to enter   [employer] of Steps: 2 Home Layout: One level Home Equipment: Cane - quad;Tub bench Additional Comments: pt stated she lives alone, she is oriented to self only and not able to provide reliable prior functional history; phoned daughter who stated she assists with dressing, supervises bathing, pt uses quad cane at times    Prior Function Level of Independence: Needs assistance   Gait / Transfers Assistance Needed: assist for tub transfer  ADL's / Homemaking Assistance Needed: assist for dressing        Hand Dominance        Extremity/Trunk Assessment   Upper Extremity Assessment Upper Extremity Assessment: Overall WFL for tasks assessed    Lower Extremity Assessment Lower Extremity Assessment: Generalized weakness  Cervical / Trunk Assessment Cervical / Trunk Assessment: Normal  Communication   Communication: No difficulties  Cognition Arousal/Alertness: Awake/alert Behavior During Therapy: WFL for tasks assessed/performed Overall Cognitive Status: History of cognitive impairments - at baseline                                         General Comments      Exercises     Assessment/Plan    PT Assessment Patient needs continued PT services  PT Problem List Decreased safety awareness;Decreased mobility;Decreased activity tolerance;Decreased balance       PT Treatment Interventions Functional mobility training;Gait training;DME instruction;Therapeutic activities;Therapeutic exercise;Patient/family education    PT Goals (Current goals can be found in the Care Plan section)  Acute Rehab PT Goals Patient Stated Goal: daughter agreeable to SNF PT Goal Formulation: With family Time For Goal Achievement: 04/07/2019 Potential to Achieve Goals: Good    Frequency Min 3X/week   Barriers to discharge Decreased caregiver support daughter stays with pt during the day, pt is alone at night    Co-evaluation               AM-PAC PT "6 Clicks" Mobility  Outcome Measure Help needed turning from your back to your side while in a flat bed without using bedrails?: A Little Help needed moving from lying on your back to sitting on the side of a flat bed without using bedrails?: A Little Help needed moving to and from a bed to a chair (including a wheelchair)?: A Little Help needed standing up from a chair using your arms (e.g., wheelchair or bedside chair)?: A Little Help needed to walk in hospital room?: A Little Help needed climbing 3-5 steps with a railing? : A Lot 6 Click Score: 17    End of Session Equipment Utilized During Treatment: Gait belt Activity Tolerance: Patient tolerated treatment well Patient left: in chair;with call bell/phone within reach;with chair alarm set Nurse Communication: Mobility status PT Visit Diagnosis: Difficulty in walking, not elsewhere classified (R26.2);Muscle weakness (generalized) (M62.81)    Time: 2947-6546 PT Time Calculation (min) (ACUTE ONLY): 15 min   Charges:   PT Evaluation $PT Eval Low Complexity: 1 Low         Blondell Reveal Kistler PT 03/06/2019  Acute  Rehabilitation Services Pager [phone] Office [phone]

## 2019-03-06 NOTE — Care Management Important Message (Signed)
Important Message  Patient Details  Name: Lynn Hardy MRN: [id number] Date of Birth: Jan 28, 1931   Medicare Important Message Given:  Yes. CMA printed out IM for the Case Manager or LCSW to give to the patient.      Tineka Uriegas 03/06/2019, 8:42 AM

## 2019-03-06 NOTE — TOC Initial Note (Signed)
Transition of Care [facility]) - Initial/Assessment Note    Patient Details  Name: Lynn Hardy MRN: [id number] Date of Birth: [birth year]  Transition of Care [facility]) CM/SW Contact:    Servando Snare, LCSW Phone Number: 03/06/2019, 3:09 PM  Clinical Narrative:     PT recommends SNF. Patients daughter [name] is agreeable. Daughter is patients primary care giver.               Expected Discharge Plan: Skilled Nursing Facility Barriers to Discharge: Continued Medical Work up   Patient Goals and CMS Choice     Choice offered to / list presented to : Adult Children  Expected Discharge Plan and Services Expected Discharge Plan: [place]   Discharge Planning Services: NA   Living arrangements for the past 2 months: Single Family Home Expected Discharge Date: (unknown)                         HH Arranged: NA          Prior Living Arrangements/Services Living arrangements for the past 2 months: Single Family Home Lives with:: Adult Children Patient language and need for interpreter reviewed:: Yes Do you feel safe going back to the place where you live?: Yes      Need for Family Participation in Patient Care: Yes (Comment) Care giver support system in place?: Yes (comment)   Criminal Activity/Legal Involvement Pertinent to Current Situation/Hospitalization: No - Comment as needed  Activities of Daily Living Home Assistive Devices/Equipment: Eyeglasses ADL Screening (condition at time of admission) Patient's cognitive ability adequate to safely complete daily activities?: Yes Is the patient deaf or have difficulty hearing?: Yes Does the patient have difficulty seeing, even when wearing glasses/contacts?: No Does the patient have difficulty concentrating, remembering, or making decisions?: Yes(sometimes) Patient able to express need for assistance with ADLs?: Yes Does the patient have difficulty dressing or bathing?: No Independently performs ADLs?:  Yes (appropriate for developmental age) Does the patient have difficulty walking or climbing stairs?: Yes Weakness of Legs: Both Weakness of Arms/Hands: None  Permission Sought/Granted Permission sought to share information with : Family Supports Permission granted to share information with : (Oriented to self)  Share Information with NAME: [name]     Permission granted to share info w Relationship: Daughter     Emotional Assessment Appearance:: Appears stated age     Orientation: : Oriented to Self Alcohol / Substance Use: Not Applicable Psych Involvement: No (comment)  Admission diagnosis:  Lower GI bleed [K92.2] Rectal bleeding [K62.5] Patient Active Problem List   Diagnosis Date Noted  . GI bleed 03/04/2019  . Rectal bleeding 03/04/2019  . AF (paroxysmal atrial fibrillation) ([place]) 03/04/2019  . Diverticulosis 03/04/2019  . Dementia ([place]) 10/16/2016  . Morbid obesity ([place]) 04/29/2016  . Ankle edema 05/08/2013  . HTN (hypertension) 05/08/2013  . NSVT (nonsustained ventricular tachycardia) ([place]) 05/08/2013  . Ectopic atrial tachycardia ([place]) 05/08/2013   PCP:  Merrilee Seashore, MD Pharmacy:   [facility] [address], [place] [address] Phone: [phone] Fax: [phone]     Social Determinants of Health (SDOH) Interventions    Readmission Risk Interventions Readmission Risk Prevention Plan 03/06/2019  Transportation Screening Complete  Some recent data might be hidden

## 2019-03-07 LAB — BASIC METABOLIC PANEL
Anion gap: 11 (ref 5–15)
BUN: 22 mg/dL (ref 8–23)
CO2: 24 mmol/L (ref 22–32)
Calcium: 8.7 mg/dL — ABNORMAL LOW (ref 8.9–10.3)
Chloride: 111 mmol/L (ref 98–111)
Creatinine, Ser: 1.07 mg/dL — ABNORMAL HIGH (ref 0.44–1.00)
GFR calc Af Amer: 55 mL/min — ABNORMAL LOW (ref >60)
GFR calc non Af Amer: 47 mL/min — ABNORMAL LOW (ref >60)
Glucose, Bld: 100 mg/dL — ABNORMAL HIGH (ref 70–99)
Potassium: 4.3 mmol/L (ref 3.5–5.1)
Sodium: 146 mmol/L — ABNORMAL HIGH (ref 135–145)

## 2019-03-07 MED ORDER — LIP MEDEX EX OINT
TOPICAL_OINTMENT | CUTANEOUS | Status: DC | PRN
  Filled 2019-03-07: qty 7

## 2019-03-07 NOTE — Progress Notes (Signed)
PROGRESS NOTE    Lynn Hardy  JYN:[id number] DOB: 1931/08/16 DOA: 03/04/2019 PCP: Merrilee Seashore, MD    Brief Narrative:  85 year old female with history of dementia and paroxysmal atrial fibrillation on full dose aspirin,who presents with rectal bleeding. Denies any gastrointestinal symptoms, denies any orthostatic symptoms. History is limited due to cognitive impairment. On her initial physical examination blood pressure 119/55, heart rate 62, respiratory rate 19, oxygen saturation 100%. She has mild pallor, her lungs are clear to auscultation bilaterally, heart S1-S2 present and rhythmic, the abdomen is soft nontender, no lower extremity edema. Sodium 134, potassium 3.5, chloride 104, bicarb 25, glucose 140, BUN 36, creatinine 1.36, AST 27, ALT 31, troponin I 0.04, white count 8.7, hemoglobin 15.2, hematocrit 45.5, platelets 137,urinalysis negative for infection, 6-10 red cells, 6-10 white cells. Hemoccult test positive.CT of the abdomen with no acute findings, markeddistention of the urinary bladder. Large hiatal hernia. Left colonic diverticulosis without diverticulitis. Her chest x-ray had hyperinflation but no infiltrates.  Patient will be admitted to the hospital working diagnosis of rectal bleeding.    Assessment & Plan:   Principal Problem:   GI bleed Active Problems:   Dementia (HCC)   Rectal bleeding   AF (paroxysmal atrial fibrillation) (HCC)   Diverticulosis   1.Rectal bleeding due to diverticulosis in the setting of anticoagulation with full dose aspirin. No more bleeding, continue to hold on aspirin. Patient tolerating po well, discontinue telemetry monitoring. Continue hydrocortisone suppository.   2.Paroxysmal atrial fibrillation.Patient has remained on sinus rhythm with controlled HR, will dc telemetry for now,.  3.Dementia.Positive confusion and disorientation, patient is very weak and deconditioned. On dobepezil and  multivitamins.   4.AKI on chronic kidney disease 3bwith hypokalemia. Patient pulled IV access, improved renal function with serum cr at 1.0 with K at 4,3 and serum bicarbonate at 24, Na at 146. Will continue to encourage po intake.   5, Obesity. Calculated BMI is 32. Will need outpatient follow up.   DVT prophylaxis:scd Code Status:full Family Communication:no family at the bedside Disposition Plan/ discharge barriers:placement at SNF.    Body mass index is 32.29 kg/m. Malnutrition Type:      Malnutrition Characteristics:      Nutrition Interventions:     RN Pressure Injury Documentation:     Consultants:     Procedures:     Antimicrobials:       Subjective: Patient had no further bleeding, tolerating po well, but continue to be very weak and deconditioned, not yet back to baseline. No nausea or vomiting, no chest pain or dyspnea.   Objective: Vitals:   03/06/19 0559 03/06/19 0723 03/06/19 1255 03/07/19 0635  BP: 132/64 132/64 (!) 167/67 (!) 154/71  Pulse: 70 70 69 (!) 56  Resp: 18 18 20 20   Temp: 98.4 F (36.9 C) 98.4 F (36.9 C) 97.9 F (36.6 C) 97.6 F (36.4 C)  TempSrc: Oral Oral Axillary Oral  SpO2: 97%   97%  Weight:  80.1 kg    Height:  5' 2.01" (1.575 m)      Intake/Output Summary (Last 24 hours) at 03/07/2019 0945 Last data filed at 03/06/2019 1300 Gross per 24 hour  Intake 50 ml  Output -  Net 50 ml   Filed Weights   03/06/19 0723  Weight: 80.1 kg    Examination:   General: deconditioned and ill looking appearing.  Neurology: Awake and alert, non focal, confused and disorientated, generalized weakness 4/5.   E ENT: mild pallor, no icterus, oral mucosa moist  Cardiovascular: No JVD. S1-S2 present, rhythmic, no gallops, rubs, or murmurs. No lower extremity edema. Pulmonary: positive breath sounds bilaterally, adequate air movement, no wheezing, rhonchi or rales. Gastrointestinal. Abdomen with no organomegaly, non  tender, no rebound or guarding Skin. No rashes Musculoskeletal: no joint deformities     Data Reviewed: I have personally reviewed following labs and imaging studies  CBC: Recent Labs  Lab 03/04/19 1242 03/05/19 0540 03/06/19 0523  WBC 8.7 6.1 5.1  NEUTROABS 7.3  --  3.4  HGB 15.2* 13.4 13.7  HCT 45.5 40.1 41.4  MCV 90.8 91.6 92.4  PLT 137* 109* 023*   Basic Metabolic Panel: Recent Labs  Lab 03/04/19 1242 03/05/19 0540 03/06/19 0523 03/07/19 0457  NA 144 146* 147* 146*  K 3.5 2.8* 3.4* 4.3  CL 104 110 112* 111  CO2 25 22 22 24   GLUCOSE 140* 92 83 100*  BUN 36* 31* 27* 22  CREATININE 1.36* 1.10* 1.24* 1.07*  CALCIUM 9.7 8.9 8.8* 8.7*   GFR: Estimated Creatinine Clearance: 37.7 mL/min (A) (by C-G formula based on SCr of 1.07 mg/dL (H)). Liver Function Tests: Recent Labs  Lab 03/04/19 1242  AST 27  ALT 31  ALKPHOS 68  BILITOT 2.2*  PROT 6.3*  ALBUMIN 3.7   No results for input(s): LIPASE, AMYLASE in the last 168 hours. No results for input(s): AMMONIA in the last 168 hours. Coagulation Profile: Recent Labs  Lab 03/04/19 1242  INR 1.0   Cardiac Enzymes: Recent Labs  Lab 03/04/19 1242  TROPONINI 0.04*   BNP (last 3 results) No results for input(s): PROBNP in the last 8760 hours. HbA1C: No results for input(s): HGBA1C in the last 72 hours. CBG: No results for input(s): GLUCAP in the last 168 hours. Lipid Profile: No results for input(s): CHOL, HDL, LDLCALC, TRIG, CHOLHDL, LDLDIRECT in the last 72 hours. Thyroid Function Tests: No results for input(s): TSH, T4TOTAL, FREET4, T3FREE, THYROIDAB in the last 72 hours. Anemia Panel: No results for input(s): VITAMINB12, FOLATE, FERRITIN, TIBC, IRON, RETICCTPCT in the last 72 hours.    Radiology Studies: I have reviewed all of the imaging during this hospital visit personally     Scheduled Meds: . B-complex with vitamin C  1 tablet Oral Daily  . cholecalciferol  1,000 Units Oral Daily  .  donepezil  10 mg Oral QHS  . hydrocortisone  25 mg Rectal BID  . memantine  10 mg Oral BID  . multivitamin with minerals  1 tablet Oral Daily  . nystatin cream   Topical BID  . pantoprazole  40 mg Oral Daily  . vitamin B-12  100 mcg Oral Daily   Continuous Infusions:   LOS: 3 days         Gerome Apley, MD

## 2019-03-07 NOTE — Progress Notes (Signed)
Foley catheter 16Fr inserted per MD order, without difficulty. Mitts applied to pt, as she is confused and pulling at tube. Will continue to monitor closely.

## 2019-03-08 MED ORDER — OLANZAPINE 2.5 MG PO TABS
2.5000 mg | ORAL_TABLET | Freq: Every day | ORAL | Status: DC
  Administered 2019-03-08 – 2019-03-13 (×6): 2.5 mg via ORAL
  Filled 2019-03-08 (×6): qty 1

## 2019-03-08 MED ORDER — OLANZAPINE 5 MG PO TABS: 5.0000 mg | ORAL_TABLET | Freq: Every day | ORAL | Status: DC

## 2019-03-08 MED ORDER — TRAMADOL HCL 50 MG PO TABS
50.0000 mg | ORAL_TABLET | Freq: Two times a day (BID) | ORAL | Status: DC | PRN
  Administered 2019-03-08 – 2019-03-13 (×6): 50 mg via ORAL
  Filled 2019-03-08 (×6): qty 1

## 2019-03-08 MED ORDER — BUTAMBEN-TETRACAINE-BENZOCAINE 2-2-14 % EX AERO
1.0000 | INHALATION_SPRAY | CUTANEOUS | Status: DC | PRN
  Administered 2019-03-08 – 2019-03-15 (×8): 1 via TOPICAL
  Filled 2019-03-08: qty 20

## 2019-03-08 MED ORDER — OXYCODONE HCL 5 MG PO TABS
5.0000 mg | ORAL_TABLET | Freq: Once | ORAL | Status: AC
  Administered 2019-03-08: 5 mg via ORAL
  Filled 2019-03-08: qty 1

## 2019-03-08 MED ORDER — MORPHINE SULFATE (PF) 2 MG/ML IV SOLN: 1.0000 mg | Freq: Once | INTRAVENOUS | Status: DC

## 2019-03-08 NOTE — Progress Notes (Signed)
PROGRESS NOTE    Lynn Hardy  XVQ:[id number] DOB: Feb 27, 1931 DOA: 03/04/2019 PCP: Merrilee Seashore, MD    Brief Narrative:  85 year old female with history of dementia and paroxysmal atrial fibrillation on full dose aspirin,who presents with rectal bleeding. Denies any gastrointestinal symptoms, denies any orthostatic symptoms. History is limited due to cognitive impairment. On her initial physical examination blood pressure 119/55, heart rate 62, respiratory rate 19, oxygen saturation 100%. She has mild pallor, her lungs are clear to auscultation bilaterally, heart S1-S2 present and rhythmic, the abdomen is soft nontender, no lower extremity edema. Sodium 134, potassium 3.5, chloride 104, bicarb 25, glucose 140, BUN 36, creatinine 1.36, AST 27, ALT 31, troponin I 0.04, white count 8.7, hemoglobin 15.2, hematocrit 45.5, platelets 137,urinalysis negative for infection, 6-10 red cells, 6-10 white cells. Hemoccult test positive.CT of the abdomen with no acute findings, markeddistention of the urinary bladder. Large hiatal hernia. Left colonic diverticulosis without diverticulitis. Her chest x-ray had hyperinflation but no infiltrates.  Patient will be admitted to the hospital working diagnosis of rectal bleeding.    Assessment & Plan:   Principal Problem:   GI bleed Active Problems:   Dementia (HCC)   Rectal bleeding   AF (paroxysmal atrial fibrillation) (HCC)   Diverticulosis  1.Rectal bleeding due to diverticulosis in the setting of anticoagulation with full dose aspirin.No further bleeding or abdominal pain.    2.Paroxysmal atrial fibrillation.Continue rate controlled, not on anticoagulation due to high fall risk.   3.Dementia with new delirium.Had worsening episodes of agitation and confusion, will continue neuro checks per unit protocol and will add low dose olanzapine at night, to improve delirium.  4.AKI on chronic kidney disease  3bwith hypokalemia. Stable renal function and electrolytes, serum cr at 1,0 with k at 4,3 and serum bicarbonate at 24. Patient is off IV fluids, will avoid daily blood work.    5, Obesity. Her calculated BMI is 32.Marland Kitchen   DVT prophylaxis:scd Code Status:full Family Communication:no family at the bedside Disposition Plan/ discharge barriers:placement at SNF.    Body mass index is 32.29 kg/m. Malnutrition Type:      Malnutrition Characteristics:      Nutrition Interventions:     RN Pressure Injury Documentation:     Consultants:     Procedures:     Antimicrobials:       Subjective: Patient has been very agitated last night, finally this am having some sleep. Foley catheter was placed, noted vaginal blisters per nursing. Patient now easy to arouse, denies any pain or dyspnea.   Objective: Vitals:   03/07/19 1039 03/07/19 1218 03/07/19 2245 03/08/19 0652  BP: (!) 151/79 (!) 143/58 (!) 140/58 (!) 124/109  Pulse: 63 (!) 59 (!) 45 (!) 55  Resp:  18 15 20   Temp: 97.6 F (36.4 C) 97.7 F (36.5 C) 98 F (36.7 C) (!) 97.5 F (36.4 C)  TempSrc: Oral Oral Oral Oral  SpO2: 100% 100% 100% 99%  Weight:      Height:        Intake/Output Summary (Last 24 hours) at 03/08/2019 1108 Last data filed at 03/07/2019 2210 Gross per 24 hour  Intake 120 ml  Output 870 ml  Net -750 ml   Filed Weights   03/06/19 0723  Weight: 80.1 kg    Examination:   General: deconditioned  Neurology: somnolent, no agitation, remains to be confused and disorientated.  E ENT: mild pallor, no icterus, oral mucosa moist Cardiovascular: No JVD. S1-S2 present, rhythmic, no gallops, rubs, or  murmurs. No lower extremity edema. Pulmonary: positive breath sounds bilaterally, adequate air movement, no wheezing, rhonchi or rales. Gastrointestinal. Abdomen with no organomegaly, non tender, no rebound or guarding Skin. No rashes Musculoskeletal: no joint deformities     Data  Reviewed: I have personally reviewed following labs and imaging studies  CBC: Recent Labs  Lab 03/04/19 1242 03/05/19 0540 03/06/19 0523  WBC 8.7 6.1 5.1  NEUTROABS 7.3  --  3.4  HGB 15.2* 13.4 13.7  HCT 45.5 40.1 41.4  MCV 90.8 91.6 92.4  PLT 137* 109* 858*   Basic Metabolic Panel: Recent Labs  Lab 03/04/19 1242 03/05/19 0540 03/06/19 0523 03/07/19 0457  NA 144 146* 147* 146*  K 3.5 2.8* 3.4* 4.3  CL 104 110 112* 111  CO2 25 22 22 24   GLUCOSE 140* 92 83 100*  BUN 36* 31* 27* 22  CREATININE 1.36* 1.10* 1.24* 1.07*  CALCIUM 9.7 8.9 8.8* 8.7*   GFR: Estimated Creatinine Clearance: 37.7 mL/min (A) (by C-G formula based on SCr of 1.07 mg/dL (H)). Liver Function Tests: Recent Labs  Lab 03/04/19 1242  AST 27  ALT 31  ALKPHOS 68  BILITOT 2.2*  PROT 6.3*  ALBUMIN 3.7   No results for input(s): LIPASE, AMYLASE in the last 168 hours. No results for input(s): AMMONIA in the last 168 hours. Coagulation Profile: Recent Labs  Lab 03/04/19 1242  INR 1.0   Cardiac Enzymes: Recent Labs  Lab 03/04/19 1242  TROPONINI 0.04*   BNP (last 3 results) No results for input(s): PROBNP in the last 8760 hours. HbA1C: No results for input(s): HGBA1C in the last 72 hours. CBG: No results for input(s): GLUCAP in the last 168 hours. Lipid Profile: No results for input(s): CHOL, HDL, LDLCALC, TRIG, CHOLHDL, LDLDIRECT in the last 72 hours. Thyroid Function Tests: No results for input(s): TSH, T4TOTAL, FREET4, T3FREE, THYROIDAB in the last 72 hours. Anemia Panel: No results for input(s): VITAMINB12, FOLATE, FERRITIN, TIBC, IRON, RETICCTPCT in the last 72 hours.    Radiology Studies: I have reviewed all of the imaging during this hospital visit personally     Scheduled Meds: . B-complex with vitamin C  1 tablet Oral Daily  . cholecalciferol  1,000 Units Oral Daily  . donepezil  10 mg Oral QHS  . hydrocortisone  25 mg Rectal BID  . memantine  10 mg Oral BID  .  multivitamin with minerals  1 tablet Oral Daily  . nystatin cream   Topical BID  . pantoprazole  40 mg Oral Daily  . vitamin B-12  100 mcg Oral Daily   Continuous Infusions:   LOS: 4 days        Amberly Livas Gerome Apley, MD

## 2019-03-08 NOTE — Progress Notes (Signed)
Pt has ulcers all over her perineum. On Call paged, verbal order received for cetacaine spray.

## 2019-03-09 NOTE — Progress Notes (Signed)
Physical Therapy Treatment Patient Details Name: Lynn Hardy MRN: [id number] DOB: 11/01/1930 Today's Date: 03/09/2019    History of Present Illness 85 year old female with history of dementia and paroxysmal atrial fibrillation on full dose aspirin, who presents with rectal bleeding    PT Comments    Pt with improved ambulation distance this session, limited by buttock pain and LE fatigue. Pt tolerated LE exercises in bed well, and was motivated to participate. Pt lacking safety awareness with mobility, requiring PT assist especially for VC and to direct RW in hallway. PT continuing to recommend SNF to address pt deficits and for safety, pt lives alone with daughter assist during the day only.   Of note, pt coughing after sipping water without straw. Pt also with wet, gurgling sound to voice. RN notified, RN to place SLP consult to evaluate.    Follow Up Recommendations  SNF;Supervision/Assistance - 24 hour     Equipment Recommendations  Rolling walker with 5" wheels    Recommendations for Other Services       Precautions / Restrictions Precautions Precautions: Fall Restrictions Weight Bearing Restrictions: No    Mobility  Bed Mobility Overal bed mobility: Needs Assistance Bed Mobility: Supine to Sit     Supine to sit: Min assist     General bed mobility comments: min assist for trunk elevation with use of HHA, scooting to EOB with use of bed pad. Pt reporting buttocks pain with bed mobility.  Transfers Overall transfer level: Needs assistance Equipment used: Rolling walker (2 wheeled) Transfers: Sit to/from Stand Sit to Stand: Min assist         General transfer comment: Min assist for power up, steadying.  Ambulation/Gait Ambulation/Gait assistance: Min assist Gait Distance (Feet): 160 Feet Assistive device: Rolling walker (2 wheeled) Gait Pattern/deviations: Step-through pattern;Decreased stride length Gait velocity: slightly decr   General  Gait Details: Min assist for steadying, redirecting RW as pt bumping into hallway objects without awareness. verbal cuing for placement in RW x4, upright posture. With 30 ft ambulation left, pt fatiguing and requiring mod verbal encouragement to continue. x1 standing rest break for fatigue.   Stairs             Wheelchair Mobility    Modified Rankin (Stroke Patients Only)       Balance Overall balance assessment: Needs assistance;History of Falls Sitting-balance support: Feet supported;Single extremity supported Sitting balance-[name]: Good     Standing balance support: Bilateral upper extremity supported Standing balance-[name]: Fair                              Cognition Arousal/Alertness: Awake/alert Behavior During Therapy: WFL for tasks assessed/performed Overall Cognitive Status: History of cognitive impairments - at baseline                                 General Comments: A&Ox2, oriented to time and situation. Requires repeated verbal cuing for safety, lacks safety awareness with RW use.      Exercises General Exercises - Lower Extremity Heel Slides: AAROM;Both;10 reps;Supine Straight Leg Raises: AROM;Both;10 reps;Supine    General Comments        Pertinent Vitals/Pain Pain Assessment: Faces Faces Pain Scale: Hurts even more Pain Location: buttocks, especially during bed mobility and sitting EOB Pain Descriptors / Indicators: Sore;Grimacing;Moaning Pain Intervention(s): Limited activity within patient's tolerance;Monitored during session;Repositioned    Home  Living                      Prior Function            PT Goals (current goals can now be found in the care plan section) Acute Rehab PT Goals Patient Stated Goal: daughter agreeable to SNF PT Goal Formulation: With family Time For Goal Achievement: 04/17/2019 Potential to Achieve Goals: Good Progress towards PT goals: Progressing toward goals     Frequency    Min 3X/week      PT Plan Current plan remains appropriate    Co-evaluation              AM-PAC PT "6 Clicks" Mobility   Outcome Measure  Help needed turning from your back to your side while in a flat bed without using bedrails?: A Little Help needed moving from lying on your back to sitting on the side of a flat bed without using bedrails?: A Little Help needed moving to and from a bed to a chair (including a wheelchair)?: A Little Help needed standing up from a chair using your arms (e.g., wheelchair or bedside chair)?: A Little Help needed to walk in hospital room?: A Little Help needed climbing 3-5 steps with a railing? : A Lot 6 Click Score: 17    End of Session Equipment Utilized During Treatment: Gait belt Activity Tolerance: Patient tolerated treatment well Patient left: in chair;with call bell/phone within reach;with chair alarm set Nurse Communication: Mobility status;Other (comment)(Pt coughing after sipping water without straw. Pt also with wet, gurgling sound to voice.) PT Visit Diagnosis: Difficulty in walking, not elsewhere classified (R26.2);Muscle weakness (generalized) (M62.81)     Time: 3614-4315 PT Time Calculation (min) (ACUTE ONLY): 18 min  Charges:  $Gait Training: 8-22 mins                     Lynn Hardy, PT Acute Rehabilitation Services Pager [phone]  Office [phone]    Lynn Hardy 03/09/2019, 12:47 PM

## 2019-03-09 NOTE — Progress Notes (Addendum)
PROGRESS NOTE    Lynn Hardy  HER:[id number] DOB: 13-Sep-1931 DOA: 03/04/2019 PCP: Merrilee Seashore, MD    Brief Narrative:  85 year old female with history of dementia and paroxysmal atrial fibrillation on full dose aspirin,who presents with rectal bleeding. Denies any gastrointestinal symptoms, denies any orthostatic symptoms. History is limited due to cognitive impairment. On her initial physical examination blood pressure 119/55, heart rate 62, respiratory rate 19, oxygen saturation 100%. She has mild pallor, her lungs are clear to auscultation bilaterally, heart S1-S2 present and rhythmic, the abdomen is soft nontender, no lower extremity edema. Sodium 134, potassium 3.5, chloride 104, bicarb 25, glucose 140, BUN 36, creatinine 1.36, AST 27, ALT 31, troponin I 0.04, white count 8.7, hemoglobin 15.2, hematocrit 45.5, platelets 137,urinalysis negative for infection, 6-10 red cells, 6-10 white cells. Hemoccult test positive.CT of the abdomen with no acute findings, markeddistention of the urinary bladder. Large hiatal hernia. Left colonic diverticulosis without diverticulitis. Her chest x-ray had hyperinflation but no infiltrates.  Patient will be admitted to the hospital working diagnosis of rectal bleeding.  No further bleeding, Hgb has been stable. She has been found to be very weak and deconditioned, plan to transfer to SNF.   Assessment & Plan:   Principal Problem:   GI bleed Active Problems:   Dementia (HCC)   Rectal bleeding   AF (paroxysmal atrial fibrillation) (HCC)   Diverticulosis   1.Rectal bleeding due to diverticulosis in the setting of anticoagulation with full dose aspirin. Clinically resolved, continue topical steroids for hemorrhoids.   2.Paroxysmal atrial fibrillation.rate controlled with no medications, not candidate for anticoagulation due to high fall risk.   3.Dementia with new delirium.Patient this am is more calm and  reactive, tolerated well olanzapine at night. Will continue neuro checks per unit protocol and physical therapy evaluations, plan to transfer to snf. Continue donepezil and memantine.   4.AKI on chronic kidney disease 3bwith hypokalemia. Clinically has resolved, will avoid daily blood work for now unless acute change in her condition.   5, Obesity.  BMI is 32.Marland Kitchen  DVT prophylaxis:scd Code Status:full Family Communication:no family at the bedside Disposition Plan/ discharge barriers:placement at SNF.   Body mass index is 32.29 kg/m. Malnutrition Type:      Malnutrition Characteristics:      Nutrition Interventions:     RN Pressure Injury Documentation:     Consultants:     Procedures:     Antimicrobials:       Subjective: This am she is out of bed to chair, no apparent pain, no nausea or vomiting, no dyspnea or chest pain. Continue to be very confused and disorientated, not agitated.   Objective: Vitals:   03/08/19 0652 03/08/19 1800 03/09/19 0000 03/09/19 0503  BP: (!) 124/109 (!) 135/59 (!) 145/61 (!) 162/68  Pulse: (!) 55 73 67 62  Resp: 20 18 20 17   Temp: (!) 97.5 F (36.4 C) 97.7 F (36.5 C) 98.9 F (37.2 C) 97.6 F (36.4 C)  TempSrc: Oral Oral  Oral  SpO2: 99% 99% 100% 100%  Weight:      Height:        Intake/Output Summary (Last 24 hours) at 03/09/2019 1242 Last data filed at 03/09/2019 8563 Gross per 24 hour  Intake 100 ml  Output 500 ml  Net -400 ml   Filed Weights   03/06/19 0723  Weight: 80.1 kg    Examination:   General: deconditioned  Neurology: Awake and alert, non focal. Disorientated.   E ENT: no pallor, no  icterus, oral mucosa moist Cardiovascular: No JVD. S1-S2 present, rhythmic, no gallops, rubs, or murmurs. No lower extremity edema. Pulmonary: positive breath sounds bilaterally, adequate air movement, no wheezing, rhonchi or rales. Gastrointestinal. Abdomen with no organomegaly, non tender, no rebound  or guarding Skin. No rashes Musculoskeletal: no joint deformities     Data Reviewed: I have personally reviewed following labs and imaging studies  CBC: Recent Labs  Lab 03/04/19 1242 03/05/19 0540 03/06/19 0523  WBC 8.7 6.1 5.1  NEUTROABS 7.3  --  3.4  HGB 15.2* 13.4 13.7  HCT 45.5 40.1 41.4  MCV 90.8 91.6 92.4  PLT 137* 109* 619*   Basic Metabolic Panel: Recent Labs  Lab 03/04/19 1242 03/05/19 0540 03/06/19 0523 03/07/19 0457  NA 144 146* 147* 146*  K 3.5 2.8* 3.4* 4.3  CL 104 110 112* 111  CO2 25 22 22 24   GLUCOSE 140* 92 83 100*  BUN 36* 31* 27* 22  CREATININE 1.36* 1.10* 1.24* 1.07*  CALCIUM 9.7 8.9 8.8* 8.7*   GFR: Estimated Creatinine Clearance: 37.7 mL/min (A) (by C-G formula based on SCr of 1.07 mg/dL (H)). Liver Function Tests: Recent Labs  Lab 03/04/19 1242  AST 27  ALT 31  ALKPHOS 68  BILITOT 2.2*  PROT 6.3*  ALBUMIN 3.7   No results for input(s): LIPASE, AMYLASE in the last 168 hours. No results for input(s): AMMONIA in the last 168 hours. Coagulation Profile: Recent Labs  Lab 03/04/19 1242  INR 1.0   Cardiac Enzymes: Recent Labs  Lab 03/04/19 1242  TROPONINI 0.04*   BNP (last 3 results) No results for input(s): PROBNP in the last 8760 hours. HbA1C: No results for input(s): HGBA1C in the last 72 hours. CBG: No results for input(s): GLUCAP in the last 168 hours. Lipid Profile: No results for input(s): CHOL, HDL, LDLCALC, TRIG, CHOLHDL, LDLDIRECT in the last 72 hours. Thyroid Function Tests: No results for input(s): TSH, T4TOTAL, FREET4, T3FREE, THYROIDAB in the last 72 hours. Anemia Panel: No results for input(s): VITAMINB12, FOLATE, FERRITIN, TIBC, IRON, RETICCTPCT in the last 72 hours.    Radiology Studies: I have reviewed all of the imaging during this hospital visit personally     Scheduled Meds: . B-complex with vitamin C  1 tablet Oral Daily  . cholecalciferol  1,000 Units Oral Daily  . donepezil  10 mg Oral  QHS  . hydrocortisone  25 mg Rectal BID  . memantine  10 mg Oral BID  . multivitamin with minerals  1 tablet Oral Daily  . nystatin cream   Topical BID  . OLANZapine  2.5 mg Oral QHS  . pantoprazole  40 mg Oral Daily  . vitamin B-12  100 mcg Oral Daily   Continuous Infusions:   LOS: 4 days        Kwaku Mostafa Gerome Apley, MD

## 2019-03-09 NOTE — Care Management Important Message (Signed)
Important Message  Patient Details IM Letter given to Servando Snare SW to present to the Patient Name: Lynn Hardy MRN: [id number] Date of Birth: 31-Jul-1931   Medicare Important Message Given:  Yes     Kerin Salen 03/09/2019, 1:10 PM

## 2019-03-09 NOTE — TOC Progression Note (Addendum)
Transition of Care [facility]) - Progression Note    Patient Details  Name: Lynn Hardy MRN: [id number] Date of Birth: [birth year]  [facility]) CM/SW Contact  Servando Snare, [place] Phone Number: 03/09/2019, 11:10 AM  Clinical Narrative:   LCSW provided daughter, [name] with bed offers. Awaiting bed choice.   4:23 PM Daughter chose bed at [facility]. LCSW notified facility.   Expected Discharge Plan: Skilled Nursing Facility Barriers to Discharge: Continued Medical Work up  Expected Discharge Plan and Services Expected Discharge Plan: [place]   Discharge Planning Services: NA   Living arrangements for the past 2 months: Single Family Home Expected Discharge Date: (unknown)                         HH Arranged: NA           Social Determinants of Health (SDOH) Interventions    Readmission Risk Interventions Readmission Risk Prevention Plan 03/06/2019  Transportation Screening Complete  Some recent data might be hidden

## 2019-03-10 MED ORDER — ZINC OXIDE 40 % EX OINT
TOPICAL_OINTMENT | Freq: Three times a day (TID) | CUTANEOUS | Status: DC
  Administered 2019-03-10 – 2019-03-16 (×19): via TOPICAL
  Administered 2019-03-17: 1 via TOPICAL
  Administered 2019-03-17 – 2019-03-18 (×3): via TOPICAL
  Filled 2019-03-10 (×2): qty 57

## 2019-03-10 MED ORDER — BENZOCAINE-MENTHOL 20-0.5 % EX AERO: 1.0000 "application " | INHALATION_SPRAY | Freq: Four times a day (QID) | CUTANEOUS | Status: DC | PRN

## 2019-03-10 MED ORDER — MORPHINE SULFATE (PF) 2 MG/ML IV SOLN
1.0000 mg | INTRAVENOUS | Status: DC | PRN
  Administered 2019-03-11 – 2019-03-14 (×8): 1 mg via INTRAVENOUS
  Filled 2019-03-10 (×8): qty 1

## 2019-03-10 MED ORDER — HALOPERIDOL LACTATE 5 MG/ML IJ SOLN
1.0000 mg | INTRAMUSCULAR | Status: DC | PRN
  Administered 2019-03-10 – 2019-03-12 (×8): 1 mg via INTRAMUSCULAR
  Filled 2019-03-10 (×9): qty 1

## 2019-03-10 NOTE — Progress Notes (Signed)
I spoke with her daughter over the phone and explained her the patient's situation, current confusion,disorientation, use of wrist restraints.  All questions were addressed.

## 2019-03-10 NOTE — TOC Progression Note (Signed)
Transition of Care [facility]) - Progression Note    Patient Details  Name: Lynn Hardy MRN: [id number] Date of Birth: 09/10/1931  Transition of Care [facility]) CM/SW Contact  Servando Snare, [place] Phone Number: 03/10/2019, 8:50 AM  Clinical Narrative:   [facility] can accept patient Wednesday if stable for dc.     Expected Discharge Plan: Skilled Nursing Facility Barriers to Discharge: Continued Medical Work up  Expected Discharge Plan and Services Expected Discharge Plan: [place]   Discharge Planning Services: NA   Living arrangements for the past 2 months: Single Family Home Expected Discharge Date: (unknown)                         HH Arranged: NA           Social Determinants of Health (SDOH) Interventions    Readmission Risk Interventions Readmission Risk Prevention Plan 03/06/2019  Transportation Screening Complete  Some recent data might be hidden

## 2019-03-10 NOTE — Progress Notes (Signed)
PROGRESS NOTE    Lynn Hardy  IBB:[id number] DOB: Nov 09, 1930 DOA: 03/04/2019 PCP: Merrilee Seashore, MD    Brief Narrative:  85 year old female with history of dementia and paroxysmal atrial fibrillation on full dose aspirin,who presents with rectal bleeding. Denies any gastrointestinal symptoms, denies any orthostatic symptoms. History is limited due to cognitive impairment. On her initial physical examination blood pressure 119/55, heart rate 62, respiratory rate 19, oxygen saturation 100%. She has mild pallor, her lungs are clear to auscultation bilaterally, heart S1-S2 present and rhythmic, the abdomen is soft nontender, no lower extremity edema. Sodium 134, potassium 3.5, chloride 104, bicarb 25, glucose 140, BUN 36, creatinine 1.36, AST 27, ALT 31, troponin I 0.04, white count 8.7, hemoglobin 15.2, hematocrit 45.5, platelets 137,urinalysis negative for infection, 6-10 red cells, 6-10 white cells. Hemoccult test positive.CT of the abdomen with no acute findings, markeddistention of the urinary bladder. Large hiatal hernia. Left colonic diverticulosis without diverticulitis. Her chest x-ray had hyperinflation but no infiltrates.  Patient will be admitted to the hospital working diagnosis of rectal bleeding.  No further bleeding, Hgb has been stable. She has been found to be very weak and deconditioned, plan to transfer to SNF.   She has developed progressive agitation, required physical and chemical restrains.   Assessment & Plan:   Principal Problem:   GI bleed Active Problems:   Dementia (HCC)   Rectal bleeding   AF (paroxysmal atrial fibrillation) (HCC)   Diverticulosis   1.Rectal bleeding due to diverticulosis in the setting of anticoagulation with full dose aspirin. On topical steroids for hemorrhoids. No further bleeding, patient did not required PRBC transfsion.   2.Paroxysmal atrial fibrillation.Her heart rate continue to be  controlled, not on av blockade, patient not candidate for anticoagulation due to fall risk and dementia.   3.Dementiawith new delirium.Today with progressive agitation and confusion, trying to pull lines and tubes. Has tolerated well olanzapine at night, will add haldol to use as needed during the day and as needed morphine for pain. Continue neuro checks and aspiration precautions.   4.AKI on chronic kidney disease 3bwith hypokalemia/ urinary retention.Patient with poor oral intake, has been confused and disorientated, will check renal panel in am. Will keep foley catheter for now due to urinary retention.  5. Perineal ulcers. Painful ulcers in the perineal region with cutaneous fungal infection, will continue local antifungal cream and will add topical zinc oxide, continue pain control.     5, Obesity. Calculated BMI is 32.Marland Kitchen  DVT prophylaxis:scd Code Status:full Family Communication:no family at the bedside Disposition Plan/ discharge barriers:placement at SNF  Body mass index is 32.29 kg/m. Malnutrition Type:      Malnutrition Characteristics:      Nutrition Interventions:     RN Pressure Injury Documentation:     Consultants:     Procedures:     Antimicrobials:       Subjective: Patient has been very agitated, pain in the perineal region, no apparent nausea or vomiting.   Objective: Vitals:   03/09/19 1428 03/09/19 2148 03/10/19 0432 03/10/19 1357  BP: 128/66 120/82 122/64 (!) 131/58  Pulse: 66 67 61 79  Resp: 17 19 18 18   Temp: 97.6 F (36.4 C) 98.7 F (37.1 C) 98.4 F (36.9 C) 97.7 F (36.5 C)  TempSrc: Oral Oral Oral Oral  SpO2: 100% 98% 99% 96%  Weight:      Height:        Intake/Output Summary (Last 24 hours) at 03/10/2019 1410 Last data filed at  03/09/2019 1605 Gross per 24 hour  Intake 592 ml  Output -  Net 592 ml   Filed Weights   03/06/19 0723  Weight: 80.1 kg    Examination:   General: deconditioned   Neurology: Awake and alert, non focal  E ENT: mild pallor, no icterus, oral mucosa moist Cardiovascular: No JVD. S1-S2 present, rhythmic, no gallops, rubs, or murmurs. No lower extremity edema. Pulmonary: positive breath sounds bilaterally, adequate air movement, no wheezing, rhonchi or rales. Gastrointestinal. Abdomen with, no organomegaly, non tender, no rebound or guarding Skin. Perineum with small ulcers, positive faint rash, no blisters or purulence.  Musculoskeletal: no joint deformities     Data Reviewed: I have personally reviewed following labs and imaging studies  CBC: Recent Labs  Lab 03/04/19 1242 03/05/19 0540 03/06/19 0523  WBC 8.7 6.1 5.1  NEUTROABS 7.3  --  3.4  HGB 15.2* 13.4 13.7  HCT 45.5 40.1 41.4  MCV 90.8 91.6 92.4  PLT 137* 109* 440*   Basic Metabolic Panel: Recent Labs  Lab 03/04/19 1242 03/05/19 0540 03/06/19 0523 03/07/19 0457  NA 144 146* 147* 146*  K 3.5 2.8* 3.4* 4.3  CL 104 110 112* 111  CO2 25 22 22 24   GLUCOSE 140* 92 83 100*  BUN 36* 31* 27* 22  CREATININE 1.36* 1.10* 1.24* 1.07*  CALCIUM 9.7 8.9 8.8* 8.7*   GFR: Estimated Creatinine Clearance: 37.7 mL/min (A) (by C-G formula based on SCr of 1.07 mg/dL (H)). Liver Function Tests: Recent Labs  Lab 03/04/19 1242  AST 27  ALT 31  ALKPHOS 68  BILITOT 2.2*  PROT 6.3*  ALBUMIN 3.7   No results for input(s): LIPASE, AMYLASE in the last 168 hours. No results for input(s): AMMONIA in the last 168 hours. Coagulation Profile: Recent Labs  Lab 03/04/19 1242  INR 1.0   Cardiac Enzymes: Recent Labs  Lab 03/04/19 1242  TROPONINI 0.04*   BNP (last 3 results) No results for input(s): PROBNP in the last 8760 hours. HbA1C: No results for input(s): HGBA1C in the last 72 hours. CBG: No results for input(s): GLUCAP in the last 168 hours. Lipid Profile: No results for input(s): CHOL, HDL, LDLCALC, TRIG, CHOLHDL, LDLDIRECT in the last 72 hours. Thyroid Function Tests: No results  for input(s): TSH, T4TOTAL, FREET4, T3FREE, THYROIDAB in the last 72 hours. Anemia Panel: No results for input(s): VITAMINB12, FOLATE, FERRITIN, TIBC, IRON, RETICCTPCT in the last 72 hours.    Radiology Studies: I have reviewed all of the imaging during this hospital visit personally     Scheduled Meds: . B-complex with vitamin C  1 tablet Oral Daily  . cholecalciferol  1,000 Units Oral Daily  . donepezil  10 mg Oral QHS  . hydrocortisone  25 mg Rectal BID  . memantine  10 mg Oral BID  . multivitamin with minerals  1 tablet Oral Daily  . nystatin cream   Topical BID  . OLANZapine  2.5 mg Oral QHS  . pantoprazole  40 mg Oral Daily  . vitamin B-12  100 mcg Oral Daily   Continuous Infusions:   LOS: 5 days        Lavell Supple Gerome Apley, MD

## 2019-03-10 NOTE — Progress Notes (Signed)
Bilateral wrist restraints applied to pt. Pt moved up in the bed with restraints secure and pt positioned for comfort. Will continue to monitor.

## 2019-03-11 LAB — BASIC METABOLIC PANEL
Anion gap: 13 (ref 5–15)
BUN: 17 mg/dL (ref 8–23)
CO2: 22 mmol/L (ref 22–32)
Calcium: 8.7 mg/dL — ABNORMAL LOW (ref 8.9–10.3)
Chloride: 106 mmol/L (ref 98–111)
Creatinine, Ser: 1.11 mg/dL — ABNORMAL HIGH (ref 0.44–1.00)
GFR calc Af Amer: 52 mL/min — ABNORMAL LOW (ref >60)
GFR calc non Af Amer: 45 mL/min — ABNORMAL LOW (ref >60)
Glucose, Bld: 108 mg/dL — ABNORMAL HIGH (ref 70–99)
Potassium: 3.3 mmol/L — ABNORMAL LOW (ref 3.5–5.1)
Sodium: 141 mmol/L (ref 135–145)

## 2019-03-11 MED ORDER — SENNOSIDES-DOCUSATE SODIUM 8.6-50 MG PO TABS
1.0000 | ORAL_TABLET | Freq: Every day | ORAL | Status: DC
  Administered 2019-03-11: 1 via ORAL
  Filled 2019-03-11: qty 1

## 2019-03-11 MED ORDER — FLUCONAZOLE 100 MG PO TABS
200.0000 mg | ORAL_TABLET | Freq: Every day | ORAL | Status: DC
  Administered 2019-03-11 – 2019-03-13 (×3): 200 mg via ORAL
  Filled 2019-03-11 (×3): qty 1

## 2019-03-11 MED ORDER — POTASSIUM CHLORIDE CRYS ER 20 MEQ PO TBCR
40.0000 meq | EXTENDED_RELEASE_TABLET | Freq: Once | ORAL | Status: AC
  Administered 2019-03-11: 40 meq via ORAL
  Filled 2019-03-11: qty 2

## 2019-03-11 MED ORDER — POLYETHYLENE GLYCOL 3350 17 G PO PACK
17.0000 g | PACK | Freq: Every day | ORAL | Status: DC
  Administered 2019-03-11 – 2019-03-12 (×2): 17 g via ORAL
  Filled 2019-03-11 (×2): qty 1

## 2019-03-11 NOTE — Progress Notes (Signed)
Patient agaitated and yelling upon shift arrival. Morphine given. Patient bathe and lien changed. Peri & Foley care complete. Transferred to new bed. Small BM. Removed restraints. Educated patient. Patient agrees not to pull on foley. Will apply mittens instead. Vitals stable. Will continue to monitor.

## 2019-03-11 NOTE — Progress Notes (Signed)
Pharmacy Antibiotic Note  Lynn Hardy is a 85 y.o. female with cutaneous fungal infection/perineal ulcers. She currently on nystatin cream.  To start fluconazole for infection.   Plan: - fluconazole 200 mg PO daily  ____________________________________  Height: 5' 2.01" (157.5 cm) Weight: 176 lb 9.4 oz (80.1 kg) IBW/kg (Calculated) : 50.12  Temp (24hrs), Avg:98.1 F (36.7 C), Min:98 F (36.7 C), Max:98.3 F (36.8 C)  Recent Labs  Lab 03/05/19 0540 03/06/19 0523 03/07/19 0457 03/11/19 0550  WBC 6.1 5.1  --   --   CREATININE 1.10* 1.24* 1.07* 1.11*    Estimated Creatinine Clearance: 36.3 mL/min (A) (by C-G formula based on SCr of 1.11 mg/dL (H)).    No Known Allergies  Thank you for allowing pharmacy to be a part of this patient's care.  Lynelle Doctor 03/11/2019 2:33 PM

## 2019-03-11 NOTE — Consult Note (Signed)
[facility] Nurse wound consult note Reason for Consult: Perineal lesions. Partial thickness lesions are noted in the interlabial area secondary to sustained exposure urinary incontinence. Presentation consistent with severe fungal overgrowth that is slowly improving, according to bedside RN and Nursing Tech caring for the patient. Wound type:Moisture associated skin damage with fungal overgrowth Pressure Injury POA:NA Measurement: N/A Wound bed: pink, moist Drainage (amount, consistency, odor) scant serous Periwound: deeper red hues and satellite lesions on the labia majora, bilateral inguinal areas and medial thighs   Dressing procedure/placement/frequency: Patient has been treated with an indwelling urinary catheter and I agree with that POC. Despite these being partial thickness and shallow lesions, urine passing over them would be painful-especially since the patient is incontinent and it would be a slow and constant exposure as opposed to a periodic large "gush" that could be easily cleansed. I will add a mattress replacement to promote air flow (microclimate management) and a pressure redistribution chair cushion for when she is OOB to the chair either here or downstream. I have also added bilateral pressure redistribution heel boots to the POC for when she is in bed.  Discussed with Dr. Horris Latino and I have recommended a short course of systemic antifungal (e.g.., Diflucan) to assist with the eradication of the fungal overgrowth. She is in agreement and plans to order.  Continuation of the indwelling urinary catheter is indicated for another 3-5 days.  [facility] nursing team will not follow, but will remain available to this patient, the nursing and medical teams.  Please re-consult if needed. Thanks, Maudie Flakes, MSN, RN, [place], Arther Abbott  Pager# [phone]

## 2019-03-11 NOTE — Progress Notes (Signed)
PROGRESS NOTE    Lynn Hardy  ION:[id number] DOB: 07/31/1931 DOA: 03/04/2019 PCP: Merrilee Seashore, MD    Brief Narrative:  85 year old female with history of dementia and paroxysmal atrial fibrillation on full dose aspirin,who presents with rectal bleeding. Denies any gastrointestinal symptoms, denies any orthostatic symptoms. History is limited due to cognitive impairment. On her initial physical examination blood pressure 119/55, heart rate 62, respiratory rate 19, oxygen saturation 100%. She has mild pallor, her lungs are clear to auscultation bilaterally, heart S1-S2 present and rhythmic, the abdomen is soft nontender, no lower extremity edema. Sodium 134, potassium 3.5, chloride 104, bicarb 25, glucose 140, BUN 36, creatinine 1.36, AST 27, ALT 31, troponin I 0.04, white count 8.7, hemoglobin 15.2, hematocrit 45.5, platelets 137,urinalysis negative for infection, 6-10 red cells, 6-10 white cells. Hemoccult test positive.CT of the abdomen with no acute findings, markeddistention of the urinary bladder. Large hiatal hernia. Left colonic diverticulosis without diverticulitis. Her chest x-ray had hyperinflation but no infiltrates. Since admission, no further bleeding, Hgb has been stable. Pt has developed progressive agitation, required physical and chemical restrains. She has been found to be very weak and deconditioned, plan to transfer to SNF.   Today, still on restraints, still somewhat restless/agitated. Denies any new complaints.    Assessment & Plan:   Principal Problem:   GI bleed Active Problems:   Dementia (HCC)   Rectal bleeding   AF (paroxysmal atrial fibrillation) (HCC)   Diverticulosis   Rectal bleeding due to diverticulosis in the setting of anticoagulation with full dose aspirin Continue topical steroids for hemorrhoids. No further bleeding, patient did not require any PRBC transfsion.   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 Cr stable Replace with K+ supplements prn Continue foley catheter due to urinary retention and also for healing perineal ulcers Need Urology outpatient follow up for voiding trial  Perineal ulcers Painful ulcers in the perineal region with cutaneous fungal infection Continue local antifungal cream, topical zinc oxide, continue pain control WOC consulted: Rec starting PO diflucan and keeping foley in for the next few days to allow ulcers heal as pt is incontinent     Obesity Calculated BMI is 32 Lifestyle modification     DVT prophylaxis:scd Code Status:full Family Communication:no family at the bedside Disposition Plan/ discharge barriers:placement at SNF  Body mass index is 32.29 kg/m. Malnutrition Type:      Malnutrition Characteristics:      Nutrition Interventions:     RN Pressure Injury Documentation:     Consultants:   None  Procedures:   None  Antimicrobials:    Diflucan- PO (antifungal)   Objective: Vitals:   03/10/19 1357 03/10/19 2056 03/11/19 0614 03/11/19 1322  BP: (!) 131/58 (!) 117/51 130/72 129/71  Pulse: 79 79 69 77  Resp: 18 18 18 18   Temp: 97.7 F (36.5 C) 98.1 F (36.7 C) 98.3 F (36.8 C) 98 F (36.7 C)  TempSrc: Oral Oral Oral Oral  SpO2: 96% 96% 100% 100%  Weight:      Height:        Intake/Output Summary (Last 24 hours) at 03/11/2019 1738 Last data filed at 03/11/2019 1342 Gross per 24 hour  Intake 0 ml  Output 900 ml  Net -900  ml   Filed Weights   03/06/19 0723  Weight: 80.1 kg    Examination:   General: Restless, agitated, deconditioned    Cardiovascular: S1, S2 present  Respiratory: CTAB  Abdomen:  Soft, nontender, nondistended, bowel sounds present  Musculoskeletal: No bilateral pedal edema noted  Skin:  Perineum with noted small ulcers  Psychiatry: Normal mood     Data Reviewed: I have personally reviewed following labs and imaging studies  CBC: Recent Labs  Lab 03/05/19 0540 03/06/19 0523  WBC 6.1 5.1  NEUTROABS  --  3.4  HGB 13.4 13.7  HCT 40.1 41.4  MCV 91.6 92.4  PLT 109* 440*   Basic Metabolic Panel: Recent Labs  Lab 03/05/19 0540 03/06/19 0523 03/07/19 0457 03/11/19 0550  NA 146* 147* 146* 141  K 2.8* 3.4* 4.3 3.3*  CL 110 112* 111 106  CO2 22 22 24 22   GLUCOSE 92 83 100* 108*  BUN 31* 27* 22 17  CREATININE 1.10* 1.24* 1.07* 1.11*  CALCIUM 8.9 8.8* 8.7* 8.7*   GFR: Estimated Creatinine Clearance: 36.3 mL/min (A) (by C-G formula based on SCr of 1.11 mg/dL (H)). Liver Function Tests: No results for input(s): AST, ALT, ALKPHOS, BILITOT, PROT, ALBUMIN in the last 168 hours. No results for input(s): LIPASE, AMYLASE in the last 168 hours. No results for input(s): AMMONIA in the last 168 hours. Coagulation Profile: No results for input(s): INR, PROTIME in the last 168 hours. Cardiac Enzymes: No results for input(s): CKTOTAL, CKMB, CKMBINDEX, TROPONINI in the last 168 hours. BNP (last 3 results) No results for input(s): PROBNP in the last 8760 hours. HbA1C: No results for input(s): HGBA1C in the last 72 hours. CBG: No results for input(s): GLUCAP in the last 168 hours. Lipid Profile: No results for input(s): CHOL, HDL, LDLCALC, TRIG, CHOLHDL, LDLDIRECT in the last 72 hours. Thyroid Function Tests: No results for input(s): TSH, T4TOTAL, FREET4, T3FREE, THYROIDAB in the last 72 hours. Anemia Panel: No results for input(s): VITAMINB12, FOLATE, FERRITIN, TIBC, IRON, RETICCTPCT in the last 72 hours.    Radiology Studies: I have reviewed all of the imaging during this hospital visit personally     Scheduled Meds: . B-complex with vitamin C  1  tablet Oral Daily  . cholecalciferol  1,000 Units Oral Daily  . donepezil  10 mg Oral QHS  . fluconazole  200 mg Oral Daily  . hydrocortisone  25 mg Rectal BID  . liver oil-zinc oxide   Topical TID  . memantine  10 mg Oral BID  . multivitamin with minerals  1 tablet Oral Daily  . nystatin cream   Topical BID  . OLANZapine  2.5 mg Oral QHS  . pantoprazole  40 mg Oral Daily  . vitamin B-12  100 mcg Oral Daily   Continuous Infusions:   LOS: 6 days        Alma Friendly, MD

## 2019-03-12 ENCOUNTER — Inpatient Hospital Stay (HOSPITAL_COMMUNITY): Payer: Medicare Other

## 2019-03-12 DIAGNOSIS — N390 Urinary tract infection, site not specified: Secondary | ICD-10-CM

## 2019-03-12 LAB — BASIC METABOLIC PANEL
Anion gap: 13 (ref 5–15)
BUN: 16 mg/dL (ref 8–23)
CO2: 20 mmol/L — ABNORMAL LOW (ref 22–32)
Calcium: 9 mg/dL (ref 8.9–10.3)
Chloride: 108 mmol/L (ref 98–111)
Creatinine, Ser: 1.03 mg/dL — ABNORMAL HIGH (ref 0.44–1.00)
GFR calc Af Amer: 57 mL/min — ABNORMAL LOW (ref >60)
GFR calc non Af Amer: 50 mL/min — ABNORMAL LOW (ref >60)
Glucose, Bld: 130 mg/dL — ABNORMAL HIGH (ref 70–99)
Potassium: 5.5 mmol/L — ABNORMAL HIGH (ref 3.5–5.1)
Sodium: 141 mmol/L (ref 135–145)

## 2019-03-12 LAB — URINALYSIS, ROUTINE W REFLEX MICROSCOPIC
Bilirubin Urine: NEGATIVE
Glucose, UA: NEGATIVE mg/dL
Ketones, ur: 5 mg/dL — AB
Nitrite: POSITIVE — AB
Protein, ur: >=300 mg/dL — AB
RBC / HPF: >50 RBC/hpf — ABNORMAL HIGH (ref 0–5)
Specific Gravity, Urine: 1.019 (ref 1.005–1.030)
WBC, UA: >50 WBC/hpf — ABNORMAL HIGH (ref 0–5)
pH: 6 (ref 5.0–8.0)

## 2019-03-12 LAB — CBC WITH DIFFERENTIAL/PLATELET
Abs Immature Granulocytes: 0.11 10*3/uL — ABNORMAL HIGH (ref 0.00–0.07)
Basophils Absolute: 0.1 10*3/uL (ref 0.0–0.1)
Basophils Relative: 1 %
Eosinophils Absolute: 0.1 10*3/uL (ref 0.0–0.5)
Eosinophils Relative: 1 %
HCT: 48.2 % — ABNORMAL HIGH (ref 36.0–46.0)
Hemoglobin: 15.7 g/dL — ABNORMAL HIGH (ref 12.0–15.0)
Immature Granulocytes: 1 %
Lymphocytes Relative: 28 %
Lymphs Abs: 4.5 10*3/uL — ABNORMAL HIGH (ref 0.7–4.0)
MCH: 31 pg (ref 26.0–34.0)
MCHC: 32.6 g/dL (ref 30.0–36.0)
MCV: 95.1 fL (ref 80.0–100.0)
Monocytes Absolute: 1.1 10*3/uL — ABNORMAL HIGH (ref 0.1–1.0)
Monocytes Relative: 7 %
Neutro Abs: 10.1 10*3/uL — ABNORMAL HIGH (ref 1.7–7.7)
Neutrophils Relative %: 62 %
Platelets: 155 10*3/uL (ref 150–400)
RBC: 5.07 MIL/uL (ref 3.87–5.11)
RDW: 13.7 % (ref 11.5–15.5)
WBC: 16 10*3/uL — ABNORMAL HIGH (ref 4.0–10.5)
nRBC: 0 % (ref 0.0–0.2)

## 2019-03-12 MED ORDER — SODIUM CHLORIDE 0.9 % IV SOLN
1.0000 g | INTRAVENOUS | Status: DC
  Administered 2019-03-12 – 2019-03-13 (×2): 1 g via INTRAVENOUS
  Filled 2019-03-12: qty 10
  Filled 2019-03-12 (×2): qty 1

## 2019-03-12 MED ORDER — POLYETHYLENE GLYCOL 3350 17 G PO PACK
17.0000 g | PACK | Freq: Two times a day (BID) | ORAL | Status: DC
  Administered 2019-03-12 – 2019-03-13 (×2): 17 g via ORAL
  Filled 2019-03-12 (×2): qty 1

## 2019-03-12 MED ORDER — SENNOSIDES-DOCUSATE SODIUM 8.6-50 MG PO TABS
1.0000 | ORAL_TABLET | Freq: Two times a day (BID) | ORAL | Status: DC
  Administered 2019-03-12 – 2019-03-13 (×3): 1 via ORAL
  Filled 2019-03-12 (×3): qty 1

## 2019-03-12 NOTE — Progress Notes (Signed)
PROGRESS NOTE    Lynn Hardy  NWG:[id number] DOB: 1931-02-06 DOA: 03/04/2019 PCP: Merrilee Seashore, MD    Brief Narrative:  85 year old female with history of dementia and paroxysmal atrial fibrillation on full dose aspirin,who presents with rectal bleeding. Denies any gastrointestinal symptoms, denies any orthostatic symptoms. History is limited due to cognitive impairment. On her initial physical examination blood pressure 119/55, heart rate 62, respiratory rate 19, oxygen saturation 100%. She has mild pallor, her lungs are clear to auscultation bilaterally, heart S1-S2 present and rhythmic, the abdomen is soft nontender, no lower extremity edema. Sodium 134, potassium 3.5, chloride 104, bicarb 25, glucose 140, BUN 36, creatinine 1.36, AST 27, ALT 31, troponin I 0.04, white count 8.7, hemoglobin 15.2, hematocrit 45.5, platelets 137,urinalysis negative for infection, 6-10 red cells, 6-10 white cells. Hemoccult test positive.CT of the abdomen with no acute findings, markeddistention of the urinary bladder. Large hiatal hernia. Left colonic diverticulosis without diverticulitis. Her chest x-ray had hyperinflation but no infiltrates. Since admission, no further bleeding, Hgb has been stable. Pt has developed progressive agitation, required physical and chemical restrains. She has been found to be very weak and deconditioned, plan to transfer to SNF.   Today, patient is off restraints.  Denies any new complaints, just sore around her perineal area.    Assessment & Plan:   Principal Problem:   GI bleed Active Problems:   Dementia (HCC)   Rectal bleeding   AF (paroxysmal atrial fibrillation) (HCC)   Diverticulosis   Rectal bleeding due to diverticulosis in the setting of anticoagulation with full dose aspirin Continue topical steroids for hemorrhoids. No further bleeding, patient did not require any PRBC transfusion  ?UTI Afebrile, with leukocytosis UA with  positive nitrites, leukocytes, WBC UC pending Start ceftriaxone  Severe constipation KUB showed moderate to large stool burden Laxative scheduled  Mild hyperkalemia Monitor closely Daily BMP  Paroxysmal atrial fibrillation Rate controlled, not on any med Not a candidate for anticoagulation due to fall risk and dementia  Dementiawith new delirium Progressive agitation and confusion, trying to pull lines, likely due to above UTI in addition to constipation Management as above Continue olanzapine at night, haldol prn Monitor closely   AKI on chronic kidney disease 3bwith hypokalemia/ urinary retention Cr stable Replace with K+ supplements prn Continue foley catheter due to urinary retention and also for healing perineal ulcers Need Urology outpatient follow up for voiding trial  Perineal ulcers Painful ulcers in the perineal region with cutaneous fungal infection Continue local antifungal cream, topical zinc oxide, continue pain control WOC consulted: Rec starting PO diflucan and keeping foley in for the next few days to allow ulcers heal as pt is incontinent     Obesity Calculated BMI is 32 Lifestyle modification     DVT prophylaxis:scd Code Status:full Family Communication:no family at the bedside Disposition Plan/ discharge barriers:placement at SNF  Body mass index is 32.29 kg/m. Malnutrition Type:      Malnutrition Characteristics:      Nutrition Interventions:     RN Pressure Injury Documentation:     Consultants:   None  Procedures:   None  Antimicrobials:  Ceftriaxone   Diflucan- PO (antifungal)   Objective: Vitals:   03/11/19 0614 03/11/19 1322 03/11/19 2108 03/12/19 0504  BP: 130/72 129/71 131/63 (!) 183/72  Pulse: 69 77 64 86  Resp: 18 18 14 12   Temp: 98.3 F (36.8 C) 98 F (36.7 C) 97.8 F (36.6 C) 98.1 F (36.7 C)  TempSrc: Oral Oral Oral Oral  SpO2: 100% 100% 97% 100%  Weight:      Height:         Intake/Output Summary (Last 24 hours) at 03/12/2019 1308 Last data filed at 03/12/2019 0830 Gross per 24 hour  Intake 120 ml  Output -  Net 120 ml   Filed Weights   03/06/19 0723  Weight: 80.1 kg    Examination:   General: NAD, less agitated today  Cardiovascular: S1, S2 present  Respiratory: CTAB  Abdomen: Soft, nontender, nondistended, bowel sounds present  Musculoskeletal: No bilateral pedal edema noted  Skin:  Perineum with noted small ulcers  Psychiatry: Normal mood   Data Reviewed: I have personally reviewed following labs and imaging studies  CBC: Recent Labs  Lab 03/06/19 0523 03/12/19 0746  WBC 5.1 16.0*  NEUTROABS 3.4 10.1*  HGB 13.7 15.7*  HCT 41.4 48.2*  MCV 92.4 95.1  PLT 107* 093   Basic Metabolic Panel: Recent Labs  Lab 03/06/19 0523 03/07/19 0457 03/11/19 0550 03/12/19 0746  NA 147* 146* 141 141  K 3.4* 4.3 3.3* 5.5*  CL 112* 111 106 108  CO2 22 24 22  20*  GLUCOSE 83 100* 108* 130*  BUN 27* 22 17 16   CREATININE 1.24* 1.07* 1.11* 1.03*  CALCIUM 8.8* 8.7* 8.7* 9.0   GFR: Estimated Creatinine Clearance: 39.1 mL/min (A) (by C-G formula based on SCr of 1.03 mg/dL (H)). Liver Function Tests: No results for input(s): AST, ALT, ALKPHOS, BILITOT, PROT, ALBUMIN in the last 168 hours. No results for input(s): LIPASE, AMYLASE in the last 168 hours. No results for input(s): AMMONIA in the last 168 hours. Coagulation Profile: No results for input(s): INR, PROTIME in the last 168 hours. Cardiac Enzymes: No results for input(s): CKTOTAL, CKMB, CKMBINDEX, TROPONINI in the last 168 hours. BNP (last 3 results) No results for input(s): PROBNP in the last 8760 hours. HbA1C: No results for input(s): HGBA1C in the last 72 hours. CBG: No results for input(s): GLUCAP in the last 168 hours. Lipid Profile: No results for input(s): CHOL, HDL, LDLCALC, TRIG, CHOLHDL, LDLDIRECT in the last 72 hours. Thyroid Function Tests: No results for input(s): TSH,  T4TOTAL, FREET4, T3FREE, THYROIDAB in the last 72 hours. Anemia Panel: No results for input(s): VITAMINB12, FOLATE, FERRITIN, TIBC, IRON, RETICCTPCT in the last 72 hours.    Radiology Studies: I have reviewed all of the imaging during this hospital visit personally     Scheduled Meds: . B-complex with vitamin C  1 tablet Oral Daily  . cholecalciferol  1,000 Units Oral Daily  . donepezil  10 mg Oral QHS  . fluconazole  200 mg Oral Daily  . hydrocortisone  25 mg Rectal BID  . liver oil-zinc oxide   Topical TID  . memantine  10 mg Oral BID  . multivitamin with minerals  1 tablet Oral Daily  . nystatin cream   Topical BID  . OLANZapine  2.5 mg Oral QHS  . pantoprazole  40 mg Oral Daily  . polyethylene glycol  17 g Oral BID  . senna-docusate  1 tablet Oral BID  . vitamin B-12  100 mcg Oral Daily   Continuous Infusions: . cefTRIAXone (ROCEPHIN)  IV       LOS: 7 days        Alma Friendly, MD

## 2019-03-12 NOTE — Progress Notes (Signed)
No wrist restraints used throughout the night. Patient is resting. Will continue to monitor.

## 2019-03-13 ENCOUNTER — Inpatient Hospital Stay (HOSPITAL_COMMUNITY): Payer: Medicare Other

## 2019-03-13 LAB — BLOOD GAS, ARTERIAL
Acid-base deficit: 6.1 mmol/L — ABNORMAL HIGH (ref 0.0–2.0)
Bicarbonate: 23.3 mmol/L (ref 20.0–28.0)
Delivery systems: POSITIVE
Drawn by: 225631
Expiratory PAP: 6
FIO2: 100
Inspiratory PAP: 12
Mechanical Rate: 14
O2 Saturation: 88.9 %
Patient temperature: 98.6
RATE: 28 resp/min
pCO2 arterial: 64 mmHg — ABNORMAL HIGH (ref 32.0–48.0)
pH, Arterial: 7.187 — CL (ref 7.350–7.450)
pO2, Arterial: 72.2 mmHg — ABNORMAL LOW (ref 83.0–108.0)

## 2019-03-13 LAB — BASIC METABOLIC PANEL
Anion gap: 9 (ref 5–15)
BUN: 13 mg/dL (ref 8–23)
CO2: 25 mmol/L (ref 22–32)
Calcium: 8.7 mg/dL — ABNORMAL LOW (ref 8.9–10.3)
Chloride: 108 mmol/L (ref 98–111)
Creatinine, Ser: 0.96 mg/dL (ref 0.44–1.00)
GFR calc Af Amer: >60 mL/min (ref >60)
GFR calc non Af Amer: 54 mL/min — ABNORMAL LOW (ref >60)
Glucose, Bld: 124 mg/dL — ABNORMAL HIGH (ref 70–99)
Potassium: 4 mmol/L (ref 3.5–5.1)
Sodium: 142 mmol/L (ref 135–145)

## 2019-03-13 LAB — CBC WITH DIFFERENTIAL/PLATELET
Abs Immature Granulocytes: 0.04 10*3/uL (ref 0.00–0.07)
Basophils Absolute: 0 10*3/uL (ref 0.0–0.1)
Basophils Relative: 1 %
Eosinophils Absolute: 0.1 10*3/uL (ref 0.0–0.5)
Eosinophils Relative: 1 %
HCT: 40.6 % (ref 36.0–46.0)
Hemoglobin: 13.2 g/dL (ref 12.0–15.0)
Immature Granulocytes: 1 %
Lymphocytes Relative: 23 %
Lymphs Abs: 2 10*3/uL (ref 0.7–4.0)
MCH: 30.3 pg (ref 26.0–34.0)
MCHC: 32.5 g/dL (ref 30.0–36.0)
MCV: 93.3 fL (ref 80.0–100.0)
Monocytes Absolute: 0.8 10*3/uL (ref 0.1–1.0)
Monocytes Relative: 9 %
Neutro Abs: 5.9 10*3/uL (ref 1.7–7.7)
Neutrophils Relative %: 65 %
Platelets: 121 10*3/uL — ABNORMAL LOW (ref 150–400)
RBC: 4.35 MIL/uL (ref 3.87–5.11)
RDW: 13.3 % (ref 11.5–15.5)
WBC: 8.8 10*3/uL (ref 4.0–10.5)
nRBC: 0 % (ref 0.0–0.2)

## 2019-03-13 MED ORDER — HYDROCODONE-ACETAMINOPHEN 5-325 MG PO TABS: 1.0000 | ORAL_TABLET | ORAL | Status: DC | PRN

## 2019-03-13 NOTE — Care Management Important Message (Signed)
Important Message  Patient Details IM Letter given to Servando Snare SW to present to the Patient Name: Lynn Hardy MRN: [id number] Date of Birth: September 18, 1930   Medicare Important Message Given:  Yes     Kerin Salen 03/13/2019, 12:03 PM

## 2019-03-13 NOTE — Progress Notes (Signed)
PROGRESS NOTE    Lynn Hardy  RUE:[id number] DOB: 06/26/1931 DOA: 03/04/2019 PCP: Merrilee Seashore, MD    Brief Narrative:  85 year old female with history of dementia and paroxysmal atrial fibrillation on full dose aspirin,who presents with rectal bleeding. Denies any gastrointestinal symptoms, denies any orthostatic symptoms. History is limited due to cognitive impairment. On her initial physical examination blood pressure 119/55, heart rate 62, respiratory rate 19, oxygen saturation 100%. She has mild pallor, her lungs are clear to auscultation bilaterally, heart S1-S2 present and rhythmic, the abdomen is soft nontender, no lower extremity edema. Sodium 134, potassium 3.5, chloride 104, bicarb 25, glucose 140, BUN 36, creatinine 1.36, AST 27, ALT 31, troponin I 0.04, white count 8.7, hemoglobin 15.2, hematocrit 45.5, platelets 137,urinalysis negative for infection, 6-10 red cells, 6-10 white cells. Hemoccult test positive.CT of the abdomen with no acute findings, markeddistention of the urinary bladder. Large hiatal hernia. Left colonic diverticulosis without diverticulitis. Her chest x-ray had hyperinflation but no infiltrates. Since admission, no further bleeding, Hgb has been stable. Pt has developed progressive agitation, required physical and chemical restrains. She has been found to be very weak and deconditioned, plan to transfer to SNF.   Today, patient with significant pain in the buttock area, unable to sit, he is better lying down.  Denies any other new complaints.    Assessment & Plan:   Principal Problem:   GI bleed Active Problems:   Dementia (HCC)   Rectal bleeding   AF (paroxysmal atrial fibrillation) (HCC)   Diverticulosis   Rectal bleeding due to diverticulosis in the setting of anticoagulation with full dose aspirin Continue topical steroids for hemorrhoids. No further bleeding, patient did not require any PRBC transfusion  UTI  Afebrile, with resolved leukocytosis UA with positive nitrites, leukocytes, WBC UC >100,000 E.coli Continue IV ceftriaxone  Severe constipation KUB showed moderate to large stool burden Laxative scheduled  Mild hyperkalemia Resolved Monitor closely Daily BMP  Paroxysmal atrial fibrillation Rate controlled, not on any med Not a candidate for anticoagulation due to fall risk and dementia  Dementiawith new delirium Progressive agitation and confusion, trying to pull lines, likely due to above UTI in addition to constipation Management as above Continue olanzapine at night, haldol prn Monitor closely   AKI on chronic kidney disease 3b/ urinary retention Cr stable Continue foley catheter due to urinary retention and also for healing perineal ulcers Need Urology outpatient follow up for voiding trial  Perineal ulcers Painful ulcers in the perineal region with cutaneous fungal infection Continue local antifungal cream, topical zinc oxide, continue pain control WOC consulted: Rec starting PO diflucan and keeping foley in for the next few days to allow ulcers heal as pt is incontinent     Obesity Calculated BMI is 32 Lifestyle modification     DVT prophylaxis:scd Code Status:full Family Communication:no family at the bedside Disposition Plan/ discharge barriers:placement at SNF  Body mass index is 32.29 kg/m. Malnutrition Type:      Malnutrition Characteristics:      Nutrition Interventions:     RN Pressure Injury Documentation:     Consultants:   None  Procedures:   None  Antimicrobials:  Ceftriaxone   Diflucan- PO (antifungal)   Objective: Vitals:   03/12/19 0504 03/12/19 1456 03/12/19 2203 03/13/19 1349  BP: (!) 183/72 (!) 167/73 130/69 (!) 144/79  Pulse: 86 68 66 77  Resp: 12 20 20 19   Temp: 98.1 F (36.7 C) 97.7 F (36.5 C) 98 F (36.7 C) 98 F (36.7  C)  TempSrc: Oral Oral Oral Oral  SpO2: 100% 100% 99% 97%  Weight:       Height:       No intake or output data in the 24 hours ending 03/13/19 1733 Filed Weights   03/06/19 0723  Weight: 80.1 kg    Examination:   General: Noted to be in mild distress due to pain   Cardiovascular: S1, S2 present  Respiratory: CTAB  Abdomen: Soft, nontender, nondistended, bowel sounds present  Musculoskeletal: No bilateral pedal edema noted  Skin: Perineum with noted small ulcers  Psychiatry: Normal mood   Data Reviewed: I have personally reviewed following labs and imaging studies  CBC: Recent Labs  Lab 03/12/19 0746 03/13/19 0543  WBC 16.0* 8.8  NEUTROABS 10.1* 5.9  HGB 15.7* 13.2  HCT 48.2* 40.6  MCV 95.1 93.3  PLT 155 233*   Basic Metabolic Panel: Recent Labs  Lab 03/07/19 0457 03/11/19 0550 03/12/19 0746 03/13/19 0543  NA 146* 141 141 142  K 4.3 3.3* 5.5* 4.0  CL 111 106 108 108  CO2 24 22 20* 25  GLUCOSE 100* 108* 130* 124*  BUN 22 17 16 13   CREATININE 1.07* 1.11* 1.03* 0.96  CALCIUM 8.7* 8.7* 9.0 8.7*   GFR: Estimated Creatinine Clearance: 42 mL/min (by C-G formula based on SCr of 0.96 mg/dL). Liver Function Tests: No results for input(s): AST, ALT, ALKPHOS, BILITOT, PROT, ALBUMIN in the last 168 hours. No results for input(s): LIPASE, AMYLASE in the last 168 hours. No results for input(s): AMMONIA in the last 168 hours. Coagulation Profile: No results for input(s): INR, PROTIME in the last 168 hours. Cardiac Enzymes: No results for input(s): CKTOTAL, CKMB, CKMBINDEX, TROPONINI in the last 168 hours. BNP (last 3 results) No results for input(s): PROBNP in the last 8760 hours. HbA1C: No results for input(s): HGBA1C in the last 72 hours. CBG: No results for input(s): GLUCAP in the last 168 hours. Lipid Profile: No results for input(s): CHOL, HDL, LDLCALC, TRIG, CHOLHDL, LDLDIRECT in the last 72 hours. Thyroid Function Tests: No results for input(s): TSH, T4TOTAL, FREET4, T3FREE, THYROIDAB in the last 72 hours. Anemia  Panel: No results for input(s): VITAMINB12, FOLATE, FERRITIN, TIBC, IRON, RETICCTPCT in the last 72 hours.    Radiology Studies: I have reviewed all of the imaging during this hospital visit personally     Scheduled Meds: . B-complex with vitamin C  1 tablet Oral Daily  . cholecalciferol  1,000 Units Oral Daily  . donepezil  10 mg Oral QHS  . fluconazole  200 mg Oral Daily  . hydrocortisone  25 mg Rectal BID  . liver oil-zinc oxide   Topical TID  . memantine  10 mg Oral BID  . multivitamin with minerals  1 tablet Oral Daily  . nystatin cream   Topical BID  . OLANZapine  2.5 mg Oral QHS  . pantoprazole  40 mg Oral Daily  . polyethylene glycol  17 g Oral BID  . senna-docusate  1 tablet Oral BID  . vitamin B-12  100 mcg Oral Daily   Continuous Infusions: . cefTRIAXone (ROCEPHIN)  IV 1 g (03/13/19 1351)     LOS: 8 days        Alma Friendly, MD

## 2019-03-13 NOTE — Progress Notes (Signed)
Lynn Hardy, upon shift, slightly anxious and agitated. Meds were  crushed and given in applesauce. No problems swallowing meds. Lynn then asks to get up. RN and tech assist Lynn to [facility]. Lynn begins coughing and chocking and patting chest. Encouraged Lynn to cough. Lynn coughed up large amounts of sputum. Lynn started gasping for breath. RN placed Lynn on oxygen. Rapid called. No order for speech. Speech consulted. Lynn transported to ICU @ 2245.

## 2019-03-13 NOTE — Progress Notes (Signed)
Physical Therapy Treatment Patient Details Name: Beatryce Colombo MRN: [id number] DOB: April 11, 1931 Today's Date: 03/13/2019    History of Present Illness 85 year old female with history of dementia and paroxysmal atrial fibrillation on full dose aspirin, who presents with rectal bleeding    PT Comments    Patient uncomfortable in the bed on arrival and agreeable to work with PT. Cognition remains an issue with pt requiring cues 90% of session for safe mobility, safe use of RW and occasional min assist for balance or to avoid objects. Patient up in chair on air cushion on departure.     Follow Up Recommendations  SNF;Supervision/Assistance - 24 hour     Equipment Recommendations  Rolling walker with 5" wheels    Recommendations for Other Services       Precautions / Restrictions Precautions Precautions: Fall Precaution Comments: daughter reports pt had loss of balance 1 month ago, pt caught herself on a chair Restrictions Weight Bearing Restrictions: No    Mobility  Bed Mobility Overal bed mobility: Needs Assistance Bed Mobility: Rolling;Sidelying to Sit;Sit to Sidelying Rolling: Supervision Sidelying to sit: Min assist     Sit to sidelying: Min assist General bed mobility comments: pt using rails to roll (even in mitts); assist to elevate trunk (?hesitant due to buttock pain); assist to raise legs onto mattress  Transfers Overall transfer level: Needs assistance Equipment used: Rolling walker (2 wheeled) Transfers: Sit to/from Stand Sit to Stand: Min assist         General transfer comment: x2 (bed, toilet); max cues for pushing off surface and assist to fully stand due to leg weakness  Ambulation/Gait Ambulation/Gait assistance: Min assist Gait Distance (Feet): 160 Feet(15 to bathroom initially) Assistive device: Rolling walker (2 wheeled) Gait Pattern/deviations: Step-through pattern;Decreased stride length;Drifts right/left Gait velocity: slightly  decr   General Gait Details: Min assist for steadying, redirecting RW as pt bumping into hallway objects without awareness. x2 standing rest break for fatigue.   Stairs             Wheelchair Mobility    Modified Rankin (Stroke Patients Only)       Balance Overall balance assessment: Needs assistance;History of Falls Sitting-balance support: Feet supported;No upper extremity supported Sitting balance-[name]: Good(able to shift anteriorly and lift/adjust hips)     Standing balance support: Bilateral upper extremity supported Standing balance-[name]: Fair                              Cognition Arousal/Alertness: Awake/alert Behavior During Therapy: WFL for tasks assessed/performed Overall Cognitive Status: History of cognitive impairments - at baseline                                 General Comments: oriented to self. Crying out for help on arrival (NT reports she does this nearly constantly); bil mitts in place      Exercises      General Comments        Pertinent Vitals/Pain Pain Assessment: Faces Faces Pain Scale: Hurts even more Pain Location: buttocks, especially sitting  Pain Descriptors / Indicators: Sore;Grimacing;Moaning Pain Intervention(s): Monitored during session;Repositioned    Home Living Family/patient expects to be discharged to:: Private residence Living Arrangements: Children Available Help at Discharge: Family;Available PRN/intermittently(daughter stays with pt during the day, pt is alone at night) Type of Home: House Home Access: Stairs to enter  Home Layout: One level Home Equipment: Cane - quad;Tub bench Additional Comments: pt stated she lives alone, she is oriented to self only and not able to provide reliable prior functional history; phoned daughter who stated she assists with dressing, supervises bathing, pt uses quad cane at times    Prior Function Level of Independence: Needs assistance   Gait / Transfers Assistance Needed: assist for tub transfer ADL's / Homemaking Assistance Needed: assist for dressing     PT Goals (current goals can now be found in the care plan section) Acute Rehab PT Goals Patient Stated Goal: daughter agreeable to SNF Time For Goal Achievement: 04/15/2019 Progress towards PT goals: Progressing toward goals    Frequency    Min 3X/week      PT Plan Current plan remains appropriate    Co-evaluation              AM-PAC PT "6 Clicks" Mobility   Outcome Measure  Help needed turning from your back to your side while in a flat bed without using bedrails?: A Little Help needed moving from lying on your back to sitting on the side of a flat bed without using bedrails?: A Little Help needed moving to and from a bed to a chair (including a wheelchair)?: A Little Help needed standing up from a chair using your arms (e.g., wheelchair or bedside chair)?: A Little Help needed to walk in hospital room?: A Little Help needed climbing 3-5 steps with a railing? : A Lot 6 Click Score: 17    End of Session Equipment Utilized During Treatment: Gait belt Activity Tolerance: Patient tolerated treatment well Patient left: in chair;with call bell/phone within reach;with chair alarm set Nurse Communication: Mobility status PT Visit Diagnosis: Difficulty in walking, not elsewhere classified (R26.2);Muscle weakness (generalized) (M62.81)     Time: 4782-9562 PT Time Calculation (min) (ACUTE ONLY): 37 min  Charges:  $Gait Training: 23-37 mins                        [employer], PT 03/13/2019, 10:51 AM

## 2019-03-13 NOTE — Progress Notes (Signed)
Rapid Response Event Note    Overview: Arrived to RM 1511 patient coughing and patting chest;  the nurse reports that  Pt. vomited with probable  Aspiration following previous medication administration. O2 Sat's at 64-72 %  Previous the patient was on room air and the nurse placed the patient on [place] 5 liters, attempted  Oral suction with younkers,   NTS suctioning done per order in which RT obtained copious amount of gastric like contents containing what appeared to be partial pills fragments.  BI-Pap placed d/t unable to maintain  O2 sat's with non-rebreather.       Initial Focused Assessment:  O2 Sat's at 64-72 % on [place] 5 liters, performed  Oral suction with younkers. NTS suctioning done per order in which RT obtained copious amount of gastric like contents containing what appeared to be partial pills fragments,  Interventions: BI-Pap, ABG, CXR, and  Transfer to SD.   [facility] Ira Dougher MSN, R-BC.

## 2019-03-13 NOTE — Progress Notes (Signed)
Physical Therapy Treatment  Clinical Impression: Patient with increased buttock pain from sitting, despite air cushion. Patient more confused and with incr difficulty and delay to follow commands for stand-pivot to bed and then sit to sidelying. (? incr confusion due to pain). Once pt in sidelying, she quickly fell asleep with no indication of pain. RN made aware.    03/13/19 1053  PT Visit Information  Last PT Received On 03/13/19  Assistance Needed +1  History of Present Illness 85 year old female with history of dementia and paroxysmal atrial fibrillation on full dose aspirin, who presents with rectal bleeding  Subjective Data  Patient Stated Goal daughter agreeable to SNF  Precautions  Precautions Fall  Precaution Comments daughter reports pt had loss of balance 1 month ago, pt caught herself on a chair  Restrictions  Weight Bearing Restrictions No  Pain Assessment  Pain Assessment Faces  Faces Pain Scale 8  Pain Location buttocks, especially sitting   Pain Descriptors / Indicators Sore;Grimacing;Restless  Pain Intervention(s) Repositioned  Cognition  Arousal/Alertness Awake/alert  Behavior During Therapy WFL for tasks assessed/performed  Overall Cognitive Status History of cognitive impairments - at baseline  General Comments oriented to self. Crying out for help on arrival (NT reports she does this nearly constantly); bil mitts in place  Bed Mobility  Overal bed mobility Needs Assistance  Bed Mobility Rolling;Sit to Sidelying  Rolling Supervision  Sit to sidelying Min assist  General bed mobility comments pt with difficulty adjusting in bed towards center of mattress; could not process how to bridge and scoot hips or how to scoot hips in sidelying  Transfers  Overall transfer level Needs assistance  Equipment used 1 person hand held assist  Transfers Stand Pivot Transfers  Stand pivot transfers Min assist  General transfer comment from chair to bed with posterior LOB   Balance  Overall balance assessment Needs assistance;History of Falls  Sitting-balance support Feet supported;No upper extremity supported  Sitting balance-[name] Good (able to shift anteriorly and lift/adjust hips)  Standing balance support Bilateral upper extremity supported  Standing balance-[name] Poor  General Comments  General comments (skin integrity, edema, etc.) While documenting outside pt's room, pt began to call for help. On entering room, she was shifting/leaning to her right to [facility] her left buttock. Patient uncomfortable and assisted with transfer back to bed. Required max cues for processing each step for transfer and return to lying down  PT - End of Session  Equipment Utilized During Treatment Gait belt  Activity Tolerance Patient tolerated treatment well  Patient left in bed;with bed alarm set  Nurse Communication Mobility status   PT - Assessment/Plan  PT Plan Current plan remains appropriate  PT Visit Diagnosis Difficulty in walking, not elsewhere classified (R26.2);Muscle weakness (generalized) (M62.81)  PT Frequency (ACUTE ONLY) Min 3X/week  Follow Up Recommendations SNF;Supervision/Assistance - 24 hour  PT equipment Rolling walker with 5" wheels  AM-PAC PT "6 Clicks" Mobility Outcome Measure (Version 2)  Help needed turning from your back to your side while in a flat bed without using bedrails? 3  Help needed moving from lying on your back to sitting on the side of a flat bed without using bedrails? 3  Help needed moving to and from a bed to a chair (including a wheelchair)? 3  Help needed standing up from a chair using your arms (e.g., wheelchair or bedside chair)? 3  Help needed to walk in hospital room? 3  Help needed climbing 3-5 steps with a railing?  2  6 Click Score 17  Consider Recommendation of Discharge To: Home with [facility]  Acute Rehab PT Goals  Time For Goal Achievement 03/22/2019  PT Time Calculation  PT Start Time (ACUTE ONLY) 1027  PT  Stop Time (ACUTE ONLY) 1039  PT Time Calculation (min) (ACUTE ONLY) 12 min  PT General Charges  $$ ACUTE PT VISIT 1 Visit  PT Treatments  $Therapeutic Activity 8-22 mins   Barry Brunner, PT

## 2019-03-14 DIAGNOSIS — J9601 Acute respiratory failure with hypoxia: Secondary | ICD-10-CM

## 2019-03-14 DIAGNOSIS — J9602 Acute respiratory failure with hypercapnia: Secondary | ICD-10-CM

## 2019-03-14 DIAGNOSIS — A419 Sepsis, unspecified organism: Secondary | ICD-10-CM

## 2019-03-14 LAB — BLOOD GAS, ARTERIAL
Acid-base deficit: 3.8 mmol/L — ABNORMAL HIGH (ref 0.0–2.0)
Acid-base deficit: 0 mmol/L (ref 0.0–2.0)
Bicarbonate: 23.6 mmol/L (ref 20.0–28.0)
Bicarbonate: 22.9 mmol/L (ref 20.0–28.0)
Delivery systems: POSITIVE
Drawn by: 514251
Drawn by: 257881
Expiratory PAP: 6
FIO2: 100
Inspiratory PAP: 12
O2 Content: 8 L/min
O2 Saturation: 96.7 %
O2 Saturation: 94.1 %
Patient temperature: 98.6
Patient temperature: 98.6
RATE: 14 resp/min
pCO2 arterial: 54.5 mmHg — ABNORMAL HIGH (ref 32.0–48.0)
pCO2 arterial: 33.8 mmHg (ref 32.0–48.0)
pH, Arterial: 7.26 — ABNORMAL LOW (ref 7.350–7.450)
pH, Arterial: 7.445 (ref 7.350–7.450)
pO2, Arterial: 107 mmHg (ref 83.0–108.0)
pO2, Arterial: 69.3 mmHg — ABNORMAL LOW (ref 83.0–108.0)

## 2019-03-14 LAB — CBC WITH DIFFERENTIAL/PLATELET
Abs Immature Granulocytes: 0.15 10*3/uL — ABNORMAL HIGH (ref 0.00–0.07)
Abs Immature Granulocytes: 0.09 10*3/uL — ABNORMAL HIGH (ref 0.00–0.07)
Basophils Absolute: 0.1 10*3/uL (ref 0.0–0.1)
Basophils Absolute: 0 10*3/uL (ref 0.0–0.1)
Basophils Relative: 0 %
Basophils Relative: 0 %
Eosinophils Absolute: 0 10*3/uL (ref 0.0–0.5)
Eosinophils Absolute: 0 10*3/uL (ref 0.0–0.5)
Eosinophils Relative: 0 %
Eosinophils Relative: 0 %
HCT: 45.3 % (ref 36.0–46.0)
HCT: 43.1 % (ref 36.0–46.0)
Hemoglobin: 14.4 g/dL (ref 12.0–15.0)
Hemoglobin: 13.8 g/dL (ref 12.0–15.0)
Immature Granulocytes: 1 %
Immature Granulocytes: 1 %
Lymphocytes Relative: 6 %
Lymphocytes Relative: 8 %
Lymphs Abs: 1.1 10*3/uL (ref 0.7–4.0)
Lymphs Abs: 1 10*3/uL (ref 0.7–4.0)
MCH: 30.9 pg (ref 26.0–34.0)
MCH: 30.1 pg (ref 26.0–34.0)
MCHC: 31.8 g/dL (ref 30.0–36.0)
MCHC: 32 g/dL (ref 30.0–36.0)
MCV: 97.2 fL (ref 80.0–100.0)
MCV: 94.1 fL (ref 80.0–100.0)
Monocytes Absolute: 0.9 10*3/uL (ref 0.1–1.0)
Monocytes Absolute: 0.7 10*3/uL (ref 0.1–1.0)
Monocytes Relative: 5 %
Monocytes Relative: 6 %
Neutro Abs: 15.3 10*3/uL — ABNORMAL HIGH (ref 1.7–7.7)
Neutro Abs: 11.4 10*3/uL — ABNORMAL HIGH (ref 1.7–7.7)
Neutrophils Relative %: 88 %
Neutrophils Relative %: 85 %
Platelets: 168 10*3/uL (ref 150–400)
Platelets: 151 10*3/uL (ref 150–400)
RBC: 4.66 MIL/uL (ref 3.87–5.11)
RBC: 4.58 MIL/uL (ref 3.87–5.11)
RDW: 13.6 % (ref 11.5–15.5)
RDW: 13.8 % (ref 11.5–15.5)
WBC: 17.4 10*3/uL — ABNORMAL HIGH (ref 4.0–10.5)
WBC: 13.2 10*3/uL — ABNORMAL HIGH (ref 4.0–10.5)
nRBC: 0 % (ref 0.0–0.2)
nRBC: 0 % (ref 0.0–0.2)

## 2019-03-14 LAB — BASIC METABOLIC PANEL
Anion gap: 13 (ref 5–15)
Anion gap: 11 (ref 5–15)
BUN: 13 mg/dL (ref 8–23)
BUN: 23 mg/dL (ref 8–23)
CO2: 26 mmol/L (ref 22–32)
CO2: 24 mmol/L (ref 22–32)
Calcium: 9 mg/dL (ref 8.9–10.3)
Calcium: 8.5 mg/dL — ABNORMAL LOW (ref 8.9–10.3)
Chloride: 106 mmol/L (ref 98–111)
Chloride: 109 mmol/L (ref 98–111)
Creatinine, Ser: 1.3 mg/dL — ABNORMAL HIGH (ref 0.44–1.00)
Creatinine, Ser: 1.47 mg/dL — ABNORMAL HIGH (ref 0.44–1.00)
GFR calc Af Amer: 43 mL/min — ABNORMAL LOW (ref >60)
GFR calc Af Amer: 37 mL/min — ABNORMAL LOW (ref >60)
GFR calc non Af Amer: 37 mL/min — ABNORMAL LOW (ref >60)
GFR calc non Af Amer: 32 mL/min — ABNORMAL LOW (ref >60)
Glucose, Bld: 253 mg/dL — ABNORMAL HIGH (ref 70–99)
Glucose, Bld: 160 mg/dL — ABNORMAL HIGH (ref 70–99)
Potassium: 3.9 mmol/L (ref 3.5–5.1)
Potassium: 4.5 mmol/L (ref 3.5–5.1)
Sodium: 145 mmol/L (ref 135–145)
Sodium: 144 mmol/L (ref 135–145)

## 2019-03-14 LAB — GLUCOSE, CAPILLARY
Glucose-Capillary: 129 mg/dL — ABNORMAL HIGH (ref 70–99)
Glucose-Capillary: 132 mg/dL — ABNORMAL HIGH (ref 70–99)
Glucose-Capillary: 170 mg/dL — ABNORMAL HIGH (ref 70–99)

## 2019-03-14 LAB — LACTIC ACID, PLASMA
Lactic Acid, Venous: 2.6 mmol/L (ref 0.5–1.9)
Lactic Acid, Venous: 1.8 mmol/L (ref 0.5–1.9)

## 2019-03-14 LAB — URINE CULTURE: Culture: >=100000 — AB

## 2019-03-14 MED ORDER — PIPERACILLIN-TAZOBACTAM 3.375 G IVPB
3.3750 g | Freq: Three times a day (TID) | INTRAVENOUS | Status: DC
  Administered 2019-03-14 – 2019-03-18 (×12): 3.375 g via INTRAVENOUS
  Filled 2019-03-14 (×12): qty 50

## 2019-03-14 MED ORDER — METOPROLOL TARTRATE 5 MG/5ML IV SOLN
INTRAVENOUS | Status: AC
  Filled 2019-03-14: qty 10

## 2019-03-14 MED ORDER — PANTOPRAZOLE SODIUM 40 MG IV SOLR
40.0000 mg | INTRAVENOUS | Status: DC
  Administered 2019-03-14 – 2019-03-16 (×3): 40 mg via INTRAVENOUS
  Filled 2019-03-14 (×3): qty 40

## 2019-03-14 MED ORDER — IPRATROPIUM-ALBUTEROL 0.5-2.5 (3) MG/3ML IN SOLN
3.0000 mL | Freq: Three times a day (TID) | RESPIRATORY_TRACT | Status: DC
  Administered 2019-03-14: 3 mL via RESPIRATORY_TRACT
  Filled 2019-03-14: qty 3

## 2019-03-14 MED ORDER — HALOPERIDOL LACTATE 5 MG/ML IJ SOLN
1.0000 mg | INTRAMUSCULAR | Status: DC | PRN
  Administered 2019-03-14 – 2019-03-15 (×2): 1 mg via INTRAVENOUS
  Filled 2019-03-14: qty 1

## 2019-03-14 MED ORDER — BISACODYL 10 MG RE SUPP: 10.0000 mg | Freq: Every day | RECTAL | Status: DC | PRN

## 2019-03-14 MED ORDER — FLUCONAZOLE 100MG IVPB
100.0000 mg | INTRAVENOUS | Status: DC
  Administered 2019-03-14 – 2019-03-16 (×3): 100 mg via INTRAVENOUS
  Filled 2019-03-14 (×3): qty 50

## 2019-03-14 MED ORDER — METOPROLOL TARTRATE 5 MG/5ML IV SOLN
5.0000 mg | INTRAVENOUS | Status: AC | PRN
  Administered 2019-03-14 – 2019-03-15 (×2): 5 mg via INTRAVENOUS
  Filled 2019-03-14: qty 5

## 2019-03-14 MED ORDER — SODIUM CHLORIDE 0.9 % IV BOLUS
1000.0000 mL | Freq: Once | INTRAVENOUS | Status: AC
  Administered 2019-03-14: 1000 mL via INTRAVENOUS

## 2019-03-14 MED ORDER — MORPHINE SULFATE (PF) 2 MG/ML IV SOLN
1.0000 mg | INTRAVENOUS | Status: DC | PRN
  Administered 2019-03-14 – 2019-03-18 (×10): 1 mg via INTRAVENOUS
  Filled 2019-03-14 (×10): qty 1

## 2019-03-14 MED ORDER — FLUCONAZOLE 100 MG PO TABS: 100.0000 mg | ORAL_TABLET | Freq: Every day | ORAL | Status: DC

## 2019-03-14 MED ORDER — SODIUM CHLORIDE 0.9 % IV SOLN
INTRAVENOUS | Status: DC
  Administered 2019-03-14 – 2019-03-16 (×3): via INTRAVENOUS

## 2019-03-14 MED ORDER — IPRATROPIUM-ALBUTEROL 0.5-2.5 (3) MG/3ML IN SOLN: 3.0000 mL | RESPIRATORY_TRACT | Status: DC | PRN

## 2019-03-14 NOTE — Progress Notes (Signed)
RT Note: Pt. placed on BiPAP prior to uneventful transport from 1511 after Rapid Response called due to pt. having vomiting/possible aspiration episodes following previous medication administration, NTS suctioning done per order in which RT obtained copious amount of gastric like contents containing what appeared to be partial pills fragments, pt. tolerated all therapies well.

## 2019-03-14 NOTE — Progress Notes (Addendum)
PROGRESS NOTE    Lynn Hardy  WYO:[id number] DOB: 08/02/1931 DOA: 03/04/2019 PCP: Merrilee Seashore, MD    Brief Narrative:  85 year old female with history of dementia and paroxysmal atrial fibrillation on full dose aspirin,who presents with rectal bleeding. Denies any gastrointestinal symptoms, denies any orthostatic symptoms. History is limited due to cognitive impairment. On her initial physical examination blood pressure 119/55, heart rate 62, respiratory rate 19, oxygen saturation 100%. She has mild pallor, her lungs are clear to auscultation bilaterally, heart S1-S2 present and rhythmic, the abdomen is soft nontender, no lower extremity edema. Sodium 134, potassium 3.5, chloride 104, bicarb 25, glucose 140, BUN 36, creatinine 1.36, AST 27, ALT 31, troponin I 0.04, white count 8.7, hemoglobin 15.2, hematocrit 45.5, platelets 137,urinalysis negative for infection, 6-10 red cells, 6-10 white cells. Hemoccult test positive.CT of the abdomen with no acute findings, markeddistention of the urinary bladder. Large hiatal hernia. Left colonic diverticulosis without diverticulitis. Her chest x-ray had hyperinflation but no infiltrates. Since admission, no further bleeding, Hgb has been stable. Pt has developed progressive agitation, required physical and chemical restrains. She has been found to be very weak and deconditioned, plan to transfer to SNF.    Overnight, patient noted to have an episode of aspiration right after pills were given.  Patient noted to desaturate to the 70s on room air, nonrebreather was placed without any significant improvement.  Rapid response was called, patient transferred to the SDU and placed on BiPAP.  Today, patient is currently on high flow nasal cannula, maintaining sats, very confused, restless.  Confirmed DNR status with daughter    Assessment & Plan:   Principal Problem:   GI bleed Active Problems:   Dementia (HCC)   Rectal  bleeding   AF (paroxysmal atrial fibrillation) (HCC)   Diverticulosis   Rectal bleeding due to diverticulosis in the setting of anticoagulation with full dose aspirin Continue topical steroids for hemorrhoids. No further bleeding, patient did not require any PRBC transfusion  Sepsis likely 2/2 aspiration pneumonia Currently afebrile, now with leukocytosis Lactic acidosis 2.6, will trend BC x2 pending Chest x-ray with no new focal infiltrate, will repeat tomorrow after hydration IV fluids IV Zosyn SLP on board recommend n.p.o. for now, aspiration precautions  Acute hypoxic and hypercarbic respiratory failure 2/2 aspiration pneumonia Currently requiring about 7 L of high flow [place] ABG showed initially significant respiratory acidosis, now improving Chest x-ray as above, will repeat Continue supplemental oxygen, PRN BiPAP IV Zosyn  AKI on chronic kidney disease 3b/ urinary retention Cr trending upwards, likely due to sepsis IV fluids, strict I&O Continue foley catheter due to urinary retention and also for healing perineal ulcers Need Urology outpatient follow up for voiding trial Daily BMP  UTI Afebrile UA with positive nitrites, leukocytes, WBC UC >100,000 E.coli Start IV Zosyn, DC IV ceftriaxone  Severe constipation KUB showed moderate to large stool burden Laxative as needed  Mild hyperkalemia Resolved Monitor closely Daily BMP  Paroxysmal atrial fibrillation Rate controlled, not on any med Not a candidate for anticoagulation due to fall risk and dementia  Dementiawith new delirium Progressive agitation and confusion, trying to pull lines, likely due to above UTI in addition to constipation Management as above Hold olanzapine, haldol prn Monitor closely   Perineal ulcers Painful ulcers in the perineal region with cutaneous fungal infection Continue local antifungal cream, topical zinc oxide, continue pain control WOC consulted: Rec starting diflucan and  keeping foley in for the next few days to allow ulcers heal as  pt is incontinent     Obesity Calculated BMI is 32 Lifestyle modification     DVT prophylaxis:scd Code Status:DNR Family Communication:no family at the bedside Disposition Plan/ discharge barriers:placement at SNF  Body mass index is 32.29 kg/m. Malnutrition Type:      Malnutrition Characteristics:      Nutrition Interventions:     RN Pressure Injury Documentation:     Consultants:   None  Procedures:   None  Antimicrobials:  Ceftriaxone-->Zosyn   Diflucan- (antifungal)   Objective: Vitals:   03/14/19 1500 03/14/19 1600 03/14/19 1648 03/14/19 1700  BP: 133/65 (!) 92/55  102/62  Pulse: 91 72  79  Resp: (!) 37 (!) 25  (!) 28  Temp:   98.1 F (36.7 C)   TempSrc:   Axillary   SpO2: 90% 94%  98%  Weight:      Height:        Intake/Output Summary (Last 24 hours) at 03/14/2019 1723 Last data filed at 03/14/2019 1716 Gross per 24 hour  Intake 120.98 ml  Output 200 ml  Net -79.02 ml   Filed Weights   03/06/19 0723  Weight: 80.1 kg    Examination:   General:  Restless, mild distress  Cardiovascular: S1, S2 present  Respiratory:  Diminished breath sounds bilaterally  Abdomen: Soft, nontender, nondistended, bowel sounds present  Musculoskeletal: No bilateral pedal edema noted  Skin:  Perineum with noted small ulcers  Psychiatry:  Confused, restless   Data Reviewed: I have personally reviewed following labs and imaging studies  CBC: Recent Labs  Lab 03/12/19 0746 03/13/19 0543 03/14/19 0142  WBC 16.0* 8.8 17.4*  NEUTROABS 10.1* 5.9 15.3*  HGB 15.7* 13.2 14.4  HCT 48.2* 40.6 45.3  MCV 95.1 93.3 97.2  PLT 155 121* 387   Basic Metabolic Panel: Recent Labs  Lab 03/11/19 0550 03/12/19 0746 03/13/19 0543 03/14/19 0142  NA 141 141 142 145  K 3.3* 5.5* 4.0 3.9  CL 106 108 108 106  CO2 22 20* 25 26  GLUCOSE 108* 130* 124* 253*  BUN 17 16 13 13    CREATININE 1.11* 1.03* 0.96 1.30*  CALCIUM 8.7* 9.0 8.7* 9.0   GFR: Estimated Creatinine Clearance: 31 mL/min (A) (by C-G formula based on SCr of 1.3 mg/dL (H)). Liver Function Tests: No results for input(s): AST, ALT, ALKPHOS, BILITOT, PROT, ALBUMIN in the last 168 hours. No results for input(s): LIPASE, AMYLASE in the last 168 hours. No results for input(s): AMMONIA in the last 168 hours. Coagulation Profile: No results for input(s): INR, PROTIME in the last 168 hours. Cardiac Enzymes: No results for input(s): CKTOTAL, CKMB, CKMBINDEX, TROPONINI in the last 168 hours. BNP (last 3 results) No results for input(s): PROBNP in the last 8760 hours. HbA1C: No results for input(s): HGBA1C in the last 72 hours. CBG: No results for input(s): GLUCAP in the last 168 hours. Lipid Profile: No results for input(s): CHOL, HDL, LDLCALC, TRIG, CHOLHDL, LDLDIRECT in the last 72 hours. Thyroid Function Tests: No results for input(s): TSH, T4TOTAL, FREET4, T3FREE, THYROIDAB in the last 72 hours. Anemia Panel: No results for input(s): VITAMINB12, FOLATE, FERRITIN, TIBC, IRON, RETICCTPCT in the last 72 hours.    Radiology Studies: I have reviewed all of the imaging during this hospital visit personally     Scheduled Meds: . hydrocortisone  25 mg Rectal BID  . liver oil-zinc oxide   Topical TID  . nystatin cream   Topical BID  . pantoprazole (PROTONIX) IV  40 mg  Intravenous Q24H   Continuous Infusions: . sodium chloride 75 mL/hr at 03/14/19 1721  . fluconazole (DIFLUCAN) IV    . piperacillin-tazobactam (ZOSYN)  IV 3.375 g (03/14/19 1402)     LOS: 9 days        Alma Friendly, MD

## 2019-03-14 NOTE — Progress Notes (Signed)
Patient noted to have a critical lactic acid level of 2.6 also patient presents with low urinary output 100 ml on writer's shift.  Notified Dr. Horris Latino of these findings and new orders received

## 2019-03-14 NOTE — Progress Notes (Addendum)
Pharmacy Antibiotic Note  Lynn Hardy is a 85 y.o. female with cutaneous fungal infection/perineal ulcers. Today MD also concerned for aspiration PNA. Pharmacy to dose Diflucan and Zosyn. Patient currently on nystatin cream. To start fluconazole for infection. Also treating for UTI with Rocephin.  Today, 03/14/2019:  WBC markedly up overnight  SCr increased  Afebrile  Rocephin for UTI stopped with Zosyn start  Plan:  Zosyn 3.375 g IV given every 8 hrs by 4-hr infusion  Given borderline CrCl, will adjust Diflucan to 100 mg daily (standard dosing for refractory intertrigo is 150 mg Qweek so current dose should be more than adequate)  Pharmacy will sign off note writing, following peripherally for culture results and changes in renal function  ____________________________________  Height: 5' 2.01" (157.5 cm) Weight: 176 lb 9.4 oz (80.1 kg) IBW/kg (Calculated) : 50.12  Temp (24hrs), Avg:98 F (36.7 C), Min:97.7 F (36.5 C), Max:98.3 F (36.8 C)  Recent Labs  Lab 03/11/19 0550 03/12/19 0746 03/13/19 0543 03/14/19 0142  WBC  --  16.0* 8.8 17.4*  CREATININE 1.11* 1.03* 0.96 1.30*    Estimated Creatinine Clearance: 31 mL/min (A) (by C-G formula based on SCr of 1.3 mg/dL (H)).    No Known Allergies  Thank you for allowing pharmacy to be a part of this patient's care.  Reuel Boom, PharmD, BCPS [phone] 03/14/2019, 10:58 AM

## 2019-03-14 NOTE — Progress Notes (Signed)
Bipap not indicated at this time.  Will continue to monitor for distress and need for Bipap.  Remains on [place] at this time.

## 2019-03-14 NOTE — Evaluation (Signed)
Clinical/Bedside Swallow Evaluation Patient Details  Name: Lynn Hardy MRN: [id number] Date of Birth: 1931/02/01  Today's Date: 03/14/2019 Time: SLP Start Time (ACUTE ONLY): 0814 SLP Stop Time (ACUTE ONLY): 1500 SLP Time Calculation (min) (ACUTE ONLY): 17 min  Past Medical History:  Past Medical History:  Diagnosis Date  . Asthma   . Atrial fibrillation ([place])   . Cataract   . NSVT (nonsustained ventricular tachycardia) ([place])   . PAT (paroxysmal atrial tachycardia) ([place])   . Stroke [facility])    Past Surgical History:  Past Surgical History:  Procedure Laterality Date  . ABDOMINAL HYSTERECTOMY     HPI:  Lynn Hardy is a 85 y.o. female with medical history significant of dementia, atrial fibrillation, CVA and dementia who presents from home on 6/17 due to rectal bleeding. Dx with GI bleed due to diverticulosis. On 6/26, patient no documented difficulty swallowing but with an episode of coughing and choking following pill intake (crushed in applesauce) followed by difficulty breathing. Transferred to ICU. Patient then with vomiting and a probably aspiration event per RN notes. Rapid respopnse called, NT suctioned large amounts of gastric contents and partial pill fragments.    Assessment / Plan / Recommendation Clinical Impression  Patient clinically not appropriate for pos at this time. Patient arousable but lethargic, confused and highly distractable requiring frequent cueing to redirect resulting in decreased awareness of bolus, periods of oral holding, and suspected delayed swallow initiation with eventual coughing post swallow (thin liquids) suggestive of decreased airway protection. Will continue to f/u. Prognosis for resume pos good with improved mentation/alertness however unclear if patient may have some pre-morbid dysphagia to account for dysphagia episodes recently observed. Will f/u.  SLP Visit Diagnosis: Dysphagia, oropharyngeal phase (R13.12)     Aspiration Risk  Severe aspiration risk    Diet Recommendation NPO   Medication Administration: Via alternative means    Other  Recommendations Oral Care Recommendations: Oral care QID   Follow up Recommendations (TBD)      Frequency and Duration min 2x/week  2 weeks       Prognosis Prognosis for Safe Diet Advancement: Good Barriers to Reach Goals: Cognitive deficits      Swallow Study   General HPI: Lynn Hardy is a 85 y.o. female with medical history significant of dementia, atrial fibrillation, CVA and dementia who presents from home on 6/17 due to rectal bleeding. Dx with GI bleed due to diverticulosis. On 6/26, patient no documented difficulty swallowing but with an episode of coughing and choking following pill intake (crushed in applesauce) followed by difficulty breathing. Transferred to ICU. Patient then with vomiting and a probably aspiration event per RN notes. Rapid respopnse called, NT suctioned large amounts of gastric contents and partial pill fragments.  Type of Study: Bedside Swallow Evaluation Previous Swallow Assessment: none Diet Prior to this Study: NPO Temperature Spikes Noted: No Respiratory Status: Nasal cannula History of Recent Intubation: No Behavior/Cognition: Confused;Lethargic/Drowsy;Requires cueing;Distractible Oral Cavity Assessment: Within Functional Limits Oral Care Completed by SLP: Recent completion by staff Oral Cavity - Dentition: Poor condition;Missing dentition Self-Feeding Abilities: Able to feed self;Needs assist Patient Positioning: Upright in bed Baseline Vocal Quality: Low vocal intensity Volitional Cough: Cognitively unable to elicit Volitional Swallow: Able to elicit    Oral/Motor/Sensory Function Overall Oral Motor/Sensory Function: Within functional limits   Ice Chips Ice chips: Impaired Presentation: Spoon Oral Phase Impairments: Impaired mastication Oral Phase Functional Implications: Prolonged oral  transit;Oral holding   Thin Liquid Thin Liquid: Impaired Presentation: Cup;Self  Fed Oral Phase Impairments: Reduced labial seal;Poor awareness of bolus Oral Phase Functional Implications: Right anterior spillage;Left anterior spillage Pharyngeal  Phase Impairments: Suspected delayed Swallow;Cough - Immediate    Nectar Thick Nectar Thick Liquid: Not tested   Honey Thick Honey Thick Liquid: Not tested   Puree Puree: Impaired Presentation: Spoon Oral Phase Impairments: Impaired mastication;Poor awareness of bolus Oral Phase Functional Implications: Oral residue;Prolonged oral transit   Solid     Solid: Not tested     Gabriel Rainwater MA, CCC-SLP   Thad Osoria Meryl 03/14/2019,3:04 PM

## 2019-03-15 ENCOUNTER — Inpatient Hospital Stay (HOSPITAL_COMMUNITY): Payer: Medicare Other

## 2019-03-15 LAB — GLUCOSE, CAPILLARY
Glucose-Capillary: 117 mg/dL — ABNORMAL HIGH (ref 70–99)
Glucose-Capillary: 121 mg/dL — ABNORMAL HIGH (ref 70–99)
Glucose-Capillary: 123 mg/dL — ABNORMAL HIGH (ref 70–99)
Glucose-Capillary: 132 mg/dL — ABNORMAL HIGH (ref 70–99)
Glucose-Capillary: 145 mg/dL — ABNORMAL HIGH (ref 70–99)
Glucose-Capillary: 147 mg/dL — ABNORMAL HIGH (ref 70–99)

## 2019-03-15 LAB — CBC WITH DIFFERENTIAL/PLATELET
Abs Immature Granulocytes: 0.06 10*3/uL (ref 0.00–0.07)
Basophils Absolute: 0 10*3/uL (ref 0.0–0.1)
Basophils Relative: 0 %
Eosinophils Absolute: 0 10*3/uL (ref 0.0–0.5)
Eosinophils Relative: 0 %
HCT: 43 % (ref 36.0–46.0)
Hemoglobin: 13.5 g/dL (ref 12.0–15.0)
Immature Granulocytes: 0 %
Lymphocytes Relative: 14 %
Lymphs Abs: 1.9 10*3/uL (ref 0.7–4.0)
MCH: 29.9 pg (ref 26.0–34.0)
MCHC: 31.4 g/dL (ref 30.0–36.0)
MCV: 95.3 fL (ref 80.0–100.0)
Monocytes Absolute: 0.9 10*3/uL (ref 0.1–1.0)
Monocytes Relative: 6 %
Neutro Abs: 11.1 10*3/uL — ABNORMAL HIGH (ref 1.7–7.7)
Neutrophils Relative %: 80 %
Platelets: 170 10*3/uL (ref 150–400)
RBC: 4.51 MIL/uL (ref 3.87–5.11)
RDW: 14.1 % (ref 11.5–15.5)
WBC: 14 10*3/uL — ABNORMAL HIGH (ref 4.0–10.5)
nRBC: 0 % (ref 0.0–0.2)

## 2019-03-15 LAB — BASIC METABOLIC PANEL
Anion gap: 14 (ref 5–15)
BUN: 24 mg/dL — ABNORMAL HIGH (ref 8–23)
CO2: 18 mmol/L — ABNORMAL LOW (ref 22–32)
Calcium: 8.4 mg/dL — ABNORMAL LOW (ref 8.9–10.3)
Chloride: 111 mmol/L (ref 98–111)
Creatinine, Ser: 1.33 mg/dL — ABNORMAL HIGH (ref 0.44–1.00)
GFR calc Af Amer: 42 mL/min — ABNORMAL LOW (ref >60)
GFR calc non Af Amer: 36 mL/min — ABNORMAL LOW (ref >60)
Glucose, Bld: 146 mg/dL — ABNORMAL HIGH (ref 70–99)
Potassium: 4.4 mmol/L (ref 3.5–5.1)
Sodium: 143 mmol/L (ref 135–145)

## 2019-03-15 MED ORDER — ORAL CARE MOUTH RINSE
15.0000 mL | Freq: Two times a day (BID) | OROMUCOSAL | Status: DC
  Administered 2019-03-15 – 2019-03-18 (×7): 15 mL via OROMUCOSAL

## 2019-03-15 MED ORDER — IPRATROPIUM-ALBUTEROL 0.5-2.5 (3) MG/3ML IN SOLN
3.0000 mL | Freq: Three times a day (TID) | RESPIRATORY_TRACT | Status: DC
  Administered 2019-03-15 – 2019-03-16 (×4): 3 mL via RESPIRATORY_TRACT
  Filled 2019-03-15 (×4): qty 3

## 2019-03-15 MED ORDER — CHLORHEXIDINE GLUCONATE CLOTH 2 % EX PADS
6.0000 | MEDICATED_PAD | Freq: Every day | CUTANEOUS | Status: DC
  Administered 2019-03-15 – 2019-03-18 (×4): 6 via TOPICAL

## 2019-03-15 MED ORDER — METOPROLOL TARTRATE 5 MG/5ML IV SOLN
2.5000 mg | Freq: Three times a day (TID) | INTRAVENOUS | Status: DC | PRN
  Administered 2019-03-15 – 2019-03-17 (×4): 2.5 mg via INTRAVENOUS
  Filled 2019-03-15 (×5): qty 5

## 2019-03-15 NOTE — Progress Notes (Signed)
PROGRESS NOTE    Krishauna Schatzman  TKZ:[id number] DOB: Feb 09, 1931 DOA: 03/04/2019 PCP: Merrilee Seashore, MD    Brief Narrative:  85 year old female with history of dementia and paroxysmal atrial fibrillation on full dose aspirin,who presents with rectal bleeding. Denies any gastrointestinal symptoms, denies any orthostatic symptoms. History is limited due to cognitive impairment. On her initial physical examination blood pressure 119/55, heart rate 62, respiratory rate 19, oxygen saturation 100%. She has mild pallor, her lungs are clear to auscultation bilaterally, heart S1-S2 present and rhythmic, the abdomen is soft nontender, no lower extremity edema. Sodium 134, potassium 3.5, chloride 104, bicarb 25, glucose 140, BUN 36, creatinine 1.36, AST 27, ALT 31, troponin I 0.04, white count 8.7, hemoglobin 15.2, hematocrit 45.5, platelets 137,urinalysis negative for infection, 6-10 red cells, 6-10 white cells. Hemoccult test positive.CT of the abdomen with no acute findings, markeddistention of the urinary bladder. Large hiatal hernia. Left colonic diverticulosis without diverticulitis. Her chest x-ray had hyperinflation but no infiltrates. Since admission, no further bleeding, Hgb has been stable. Pt has developed progressive agitation, required physical and chemical restrains. She has been found to be very weak and deconditioned, plan to transfer to SNF.   Today, patient still very confused, with periods of agitation as well as lethargy.  Still requiring oxygen supplementation, IV fluids due to poor urine output.  Noted to be tachycardic, in and out of A. Fib.    Assessment & Plan:   Principal Problem:   GI bleed Active Problems:   Dementia (HCC)   Rectal bleeding   AF (paroxysmal atrial fibrillation) (HCC)   Diverticulosis   Rectal bleeding due to diverticulosis in the setting of anticoagulation with full dose aspirin Continue topical steroids for hemorrhoids. No  further bleeding, patient did not require any PRBC transfusion  Sepsis likely 2/2 aspiration pneumonia Currently afebrile, now with leukocytosis Lactic acidosis 2.6-->1.8 BC x2 NGTD Chest x-ray with no new focal infiltrate, repeat pending IV fluids IV Zosyn SLP on board recommend n.p.o. for now, aspiration precautions  Acute hypoxic and hypercarbic respiratory failure 2/2 aspiration pneumonia Still requiring supplemental O2 ABG showed initially significant respiratory acidosis, repeat improved Chest x-ray as above, repeat pending Continue supplemental oxygen, PRN BiPAP IV Zosyn  AKI on chronic kidney disease 3b/ urinary retention Cr trending upwards, likely due to sepsis IV fluids, strict I&O Continue foley catheter due to urinary retention and also for healing perineal ulcers Need Urology outpatient follow up for voiding trial Daily BMP  UTI Afebrile UA with positive nitrites, leukocytes, WBC UC >100,000 E.coli IV Zosyn, DC IV ceftriaxone  Severe constipation KUB showed moderate to large stool burden Laxative as needed  Paroxysmal atrial fibrillation Currently rate uncontrolled, HR in the 110s IV metoprolol as needed, SBP tends to be on the soft side, if persistent will start diltiazem drip pending BP Not a candidate for anticoagulation due to fall risk and dementia  Dementiawith new delirium Progressive agitation and confusion, trying to pull lines, likely due to above UTI in addition to constipation Management as above Hold olanzapine, haldol prn Monitor closely   Perineal ulcers Painful ulcers in the perineal region with cutaneous fungal infection Continue local antifungal cream, topical zinc oxide, continue pain control WOC consulted: Rec starting diflucan and keeping foley in for the next few days to allow ulcers heal as pt is incontinent     Obesity Calculated BMI is 32 Lifestyle modification     DVT prophylaxis:scd Code Status:DNR Family  Communication:no family at the bedside Disposition Plan/  discharge barriers:placement at SNF  Body mass index is 32.29 kg/m. Malnutrition Type:      Malnutrition Characteristics:      Nutrition Interventions:     RN Pressure Injury Documentation:     Consultants:   None  Procedures:   None  Antimicrobials:  Ceftriaxone-->Zosyn   Diflucan- (antifungal)   Objective: Vitals:   03/15/19 1100 03/15/19 1200 03/15/19 1331 03/15/19 1340  BP: 139/88 133/71    Pulse: (!) 103 (!) 52    Resp: (!) 37 (!) 26    Temp:    98.1 F (36.7 C)  TempSrc:    Axillary  SpO2: 95% 100% 98%   Weight:      Height:        Intake/Output Summary (Last 24 hours) at 03/15/2019 1532 Last data filed at 03/15/2019 0555 Gross per 24 hour  Intake 1263.37 ml  Output 250 ml  Net 1013.37 ml   Filed Weights   03/06/19 0723  Weight: 80.1 kg    Examination:   General:  Restless with periods of agitation, in mild distress  Cardiovascular: S1, S2 present  Respiratory:  Poor respiratory effort  Abdomen: Soft, nontender, nondistended, bowel sounds present  Musculoskeletal: No bilateral pedal edema noted  Skin:  Perineum with noted small ulcers  Psychiatry:  Confused   Data Reviewed: I have personally reviewed following labs and imaging studies  CBC: Recent Labs  Lab 03/12/19 0746 03/13/19 0543 03/14/19 0142 03/14/19 1945 03/15/19 0222  WBC 16.0* 8.8 17.4* 13.2* 14.0*  NEUTROABS 10.1* 5.9 15.3* 11.4* 11.1*  HGB 15.7* 13.2 14.4 13.8 13.5  HCT 48.2* 40.6 45.3 43.1 43.0  MCV 95.1 93.3 97.2 94.1 95.3  PLT 155 121* 168 151 384   Basic Metabolic Panel: Recent Labs  Lab 03/12/19 0746 03/13/19 0543 03/14/19 0142 03/14/19 1945 03/15/19 0222  NA 141 142 145 144 143  K 5.5* 4.0 3.9 4.5 4.4  CL 108 108 106 109 111  CO2 20* 25 26 24  18*  GLUCOSE 130* 124* 253* 160* 146*  BUN 16 13 13 23  24*  CREATININE 1.03* 0.96 1.30* 1.47* 1.33*  CALCIUM 9.0 8.7* 9.0 8.5* 8.4*    GFR: Estimated Creatinine Clearance: 30.3 mL/min (A) (by C-G formula based on SCr of 1.33 mg/dL (H)). Liver Function Tests: No results for input(s): AST, ALT, ALKPHOS, BILITOT, PROT, ALBUMIN in the last 168 hours. No results for input(s): LIPASE, AMYLASE in the last 168 hours. No results for input(s): AMMONIA in the last 168 hours. Coagulation Profile: No results for input(s): INR, PROTIME in the last 168 hours. Cardiac Enzymes: No results for input(s): CKTOTAL, CKMB, CKMBINDEX, TROPONINI in the last 168 hours. BNP (last 3 results) No results for input(s): PROBNP in the last 8760 hours. HbA1C: No results for input(s): HGBA1C in the last 72 hours. CBG: Recent Labs  Lab 03/14/19 2000 03/14/19 2357 03/15/19 0351 03/15/19 0842 03/15/19 1258  GLUCAP 129* 132* 145* 123* 117*   Lipid Profile: No results for input(s): CHOL, HDL, LDLCALC, TRIG, CHOLHDL, LDLDIRECT in the last 72 hours. Thyroid Function Tests: No results for input(s): TSH, T4TOTAL, FREET4, T3FREE, THYROIDAB in the last 72 hours. Anemia Panel: No results for input(s): VITAMINB12, FOLATE, FERRITIN, TIBC, IRON, RETICCTPCT in the last 72 hours.    Radiology Studies: I have reviewed all of the imaging during this hospital visit personally     Scheduled Meds: . Chlorhexidine Gluconate Cloth  6 each Topical Daily  . hydrocortisone  25 mg Rectal BID  . ipratropium-albuterol  3 mL Nebulization TID  . liver oil-zinc oxide   Topical TID  . mouth rinse  15 mL Mouth Rinse BID  . nystatin cream   Topical BID  . pantoprazole (PROTONIX) IV  40 mg Intravenous Q24H   Continuous Infusions: . sodium chloride 100 mL/hr at 03/14/19 1738  . fluconazole (DIFLUCAN) IV Stopped (03/14/19 1936)  . piperacillin-tazobactam (ZOSYN)  IV 3.375 g (03/15/19 1308)     LOS: 10 days        Alma Friendly, MD

## 2019-03-15 NOTE — Progress Notes (Signed)
SLP Cancellation Note  Patient Details Name: Lynn Hardy MRN: [id number] DOB: November 14, 1930   Cancelled treatment:       Reason Eval/Treat Not Completed: Fatigue/lethargy limiting ability to participate. Discussed pt with RN, who reports that she is still lethargic today, except for moments of pain. Would allow additional time before attempting POs. SLP will continue to follow for readiness.   Venita Sheffield Veona Bittman 03/15/2019, 12:30 PM  Pollyann Glen, M.A. [place] Acute Environmental education officer [phone] Office [phone]

## 2019-03-15 NOTE — Progress Notes (Signed)
Bipap not indicated at this time. 

## 2019-03-16 ENCOUNTER — Inpatient Hospital Stay (HOSPITAL_COMMUNITY): Payer: Medicare Other

## 2019-03-16 DIAGNOSIS — T17908A Unspecified foreign body in respiratory tract, part unspecified causing other injury, initial encounter: Secondary | ICD-10-CM

## 2019-03-16 DIAGNOSIS — Z515 Encounter for palliative care: Secondary | ICD-10-CM

## 2019-03-16 DIAGNOSIS — T17908S Unspecified foreign body in respiratory tract, part unspecified causing other injury, sequela: Secondary | ICD-10-CM

## 2019-03-16 DIAGNOSIS — Z7189 Other specified counseling: Secondary | ICD-10-CM

## 2019-03-16 LAB — BASIC METABOLIC PANEL
Anion gap: 11 (ref 5–15)
BUN: 21 mg/dL (ref 8–23)
CO2: 21 mmol/L — ABNORMAL LOW (ref 22–32)
Calcium: 8.4 mg/dL — ABNORMAL LOW (ref 8.9–10.3)
Chloride: 115 mmol/L — ABNORMAL HIGH (ref 98–111)
Creatinine, Ser: 1.05 mg/dL — ABNORMAL HIGH (ref 0.44–1.00)
GFR calc Af Amer: 56 mL/min — ABNORMAL LOW (ref >60)
GFR calc non Af Amer: 48 mL/min — ABNORMAL LOW (ref >60)
Glucose, Bld: 141 mg/dL — ABNORMAL HIGH (ref 70–99)
Potassium: 4.3 mmol/L (ref 3.5–5.1)
Sodium: 147 mmol/L — ABNORMAL HIGH (ref 135–145)

## 2019-03-16 LAB — CBC
HCT: 42 % (ref 36.0–46.0)
Hemoglobin: 12.9 g/dL (ref 12.0–15.0)
MCH: 30.4 pg (ref 26.0–34.0)
MCHC: 30.7 g/dL (ref 30.0–36.0)
MCV: 99.1 fL (ref 80.0–100.0)
Platelets: 149 10*3/uL — ABNORMAL LOW (ref 150–400)
RBC: 4.24 MIL/uL (ref 3.87–5.11)
RDW: 14.1 % (ref 11.5–15.5)
WBC: 10.5 10*3/uL (ref 4.0–10.5)
nRBC: 0 % (ref 0.0–0.2)

## 2019-03-16 LAB — DIFFERENTIAL
Abs Immature Granulocytes: 0.06 10*3/uL (ref 0.00–0.07)
Basophils Absolute: 0 10*3/uL (ref 0.0–0.1)
Basophils Relative: 0 %
Eosinophils Absolute: 0 10*3/uL (ref 0.0–0.5)
Eosinophils Relative: 0 %
Immature Granulocytes: 1 %
Lymphocytes Relative: 4 %
Lymphs Abs: 0.4 10*3/uL — ABNORMAL LOW (ref 0.7–4.0)
Monocytes Absolute: 0.6 10*3/uL (ref 0.1–1.0)
Monocytes Relative: 5 %
Neutro Abs: 9.5 10*3/uL — ABNORMAL HIGH (ref 1.7–7.7)
Neutrophils Relative %: 90 %

## 2019-03-16 LAB — GLUCOSE, CAPILLARY
Glucose-Capillary: 150 mg/dL — ABNORMAL HIGH (ref 70–99)
Glucose-Capillary: 157 mg/dL — ABNORMAL HIGH (ref 70–99)
Glucose-Capillary: 134 mg/dL — ABNORMAL HIGH (ref 70–99)
Glucose-Capillary: 144 mg/dL — ABNORMAL HIGH (ref 70–99)
Glucose-Capillary: 164 mg/dL — ABNORMAL HIGH (ref 70–99)

## 2019-03-16 LAB — MRSA PCR SCREENING: MRSA by PCR: POSITIVE — AB

## 2019-03-16 MED ORDER — MUPIROCIN 2 % EX OINT
1.0000 "application " | TOPICAL_OINTMENT | Freq: Two times a day (BID) | CUTANEOUS | Status: DC
  Administered 2019-03-16 – 2019-03-18 (×4): 1 via NASAL
  Filled 2019-03-16 (×2): qty 22

## 2019-03-16 MED ORDER — LEVALBUTEROL HCL 0.63 MG/3ML IN NEBU
0.6300 mg | INHALATION_SOLUTION | Freq: Three times a day (TID) | RESPIRATORY_TRACT | Status: DC
  Administered 2019-03-16 – 2019-03-18 (×6): 0.63 mg via RESPIRATORY_TRACT
  Filled 2019-03-16 (×6): qty 3

## 2019-03-16 MED ORDER — LEVALBUTEROL HCL 0.63 MG/3ML IN NEBU: 0.6300 mg | INHALATION_SOLUTION | Freq: Three times a day (TID) | RESPIRATORY_TRACT | Status: DC

## 2019-03-16 NOTE — Progress Notes (Signed)
Modified Barium Swallow Progress Note  Patient Details  Name: Alania Overholt MRN: [id number] Date of Birth: December 16, 1930  Today's Date: 03/16/2019  Modified Barium Swallow completed.  Full report located under Chart Review in the Imaging Section.  Brief recommendations include the following:  Clinical Impression  Patient presents with a mild-moderate sensori-motor dysphagia with oral transit delays, lingual pumping and piecemeal swallowing of purees and regular solids, swallow initiation delay to vallecular sinus with puree and regular solids, swallow initiation delay to pyrirform sinus with thin and nectar thick liquids, aspiration during the swallow with thin liquids that patient sensed after it had passed through vocal cords into trachea and ineffective in clearing aspirate. She had trace to mild vallecular residuals with nectar and thin liquids and mild vallecular residuals with puree and regular solids, which cleared with subsequent swallows. No penetration or aspiration with nectar thick liquids with cup sips or successive straw sips.    Swallow Evaluation Recommendations       SLP Diet Recommendations: Dysphagia 1 (Puree) solids;Nectar thick liquid   Liquid Administration via: Straw;Cup   Medication Administration: Crushed with puree       Compensations: Minimize environmental distractions;Slow rate;Small sips/bites   Postural Changes: Seated upright at 90 degrees   Oral Care Recommendations: Oral care BID   Other Recommendations: Order thickener from pharmacy;Prohibited food (jello, ice cream, thin soups);Remove water pitcher;Clarify dietary restrictions    Dannial Monarch 03/16/2019,4:45 PM   Sonia Baller, MA, CCC-SLP Speech Therapy [facility]

## 2019-03-16 NOTE — Consult Note (Signed)
Consultation Note Date: 03/16/2019   Patient Name: Lynn Hardy  DOB: 02-26-1931  MRN: [id number]  Age / Sex: 85 y.o., female  PCP: Merrilee Seashore, MD Referring Physician: Alma Friendly, MD  Reason for Consultation: goals of care discussions.   HPI/Patient Profile: 85 y.o. female  admitted on 03/04/2019   Clinical Assessment and Goals of Care: 85 yo lady with a fib, on ASA, hiatal hernia and dementia who lives alone, with patient's 2 daughters checking up on her frequently.   The patient presented with blood per rectum. She has been admitted with sepsis aspiration PNA AKI III CKD constipation. Hospital course has been complicated by confusion, agitation. Patient has been seen by SLP with recommendations for NPO for now, due to fatigue and lethargy. She is on Haldol, Anusol cream, Morphine IV PRN ( she has required 4 doses of 1 mg each over the past 24 hours.) she is on antibiotics.   A palliative consult has been requested for goals of care discussions.   The patient is a frail lady, confused. Currently resting in bed. She is not able to engage or participate in conversations. Discussed with bedside RN who stated that the patient becomes easily agitated, she has been attempting to d/c her IV O2 etc.   Call placed and discussed with daughter Lynn Hardy. I introduced myself and palliative care as follows: Palliative medicine is specialized medical care for people living with serious illness. It focuses on providing relief from the symptoms and stress of a serious illness. The goal is to improve quality of life for both the patient and the family.  Goals of care: Broad aims of medical therapy in relation to the patient's values and preferences. Our aim is to provide medical care aimed at enabling patients to achieve the goals that matter most to them, given the circumstances of their particular  medical situation and their constraints.   Patient's daughter states that she hasn't been able to walk in a week. Prior to that, she was walking/shuffling short distances. She uses a cane. Patient prefers soft foods mostly. Daughter visits with her twice a day and assists her. Patient recently stumbled out of her recliner. Patient doesn't have home health care at home. Patient's sister died today and daughter feels overwhelmed.   We discussed about next steps, overall goals of care. Discussed about monitoring patient's disease trajectory in the hospital. Options for residential hospice versus SNF were briefly discussed. See below.   Patient's primary decision maker is her daughter Lynn Hardy [phone].  Patient has 2 daughters.     SUMMARY OF RECOMMENDATIONS   DNR DNI re discussed and confirmed with patient's daughter Lynn Hardy on the phone.  Discussed about artificial nutrition and hydration. Patient's daughter states patient made a living will, discussed about no PEG tube, no NG tube.  Time trial of current interventions for the next day or so. Discussed with daughter on the phone about options for comfort measures and likely residential hospice if there is no significant or meaningful  recovery.  PMT will continue to follow.   Code Status/Advance Care Planning:  DNR DNI    Symptom Management:   Continue current mode of care.   Palliative Prophylaxis:   Delirium Protocol  Additional Recommendations (Limitations, Scope, Preferences):  No Artificial Feeding  Psycho-social/Spiritual:   Desire for further Chaplaincy support: no   Additional Recommendations: Education on Hospice  Prognosis:   Unable to determine  Discharge Planning: To Be Determined      Primary Diagnoses: Present on Admission: . GI bleed . Rectal bleeding . Dementia ([place])   I have reviewed the medical record, interviewed the patient and family, and examined the patient. The following aspects  are pertinent.  Past Medical History:  Diagnosis Date  . Asthma   . Atrial fibrillation ([place])   . Cataract   . NSVT (nonsustained ventricular tachycardia) ([place])   . PAT (paroxysmal atrial tachycardia) ([place])   . Stroke [facility])    Social History   Socioeconomic History  . Marital status: Divorced    Spouse name: Not on file  . Number of children: 2  . Years of education: 55  . Highest education level: Not on file  Occupational History  . Occupation: retired  Scientific laboratory technician  . Financial resource strain: Not on file  . Food insecurity    Worry: Not on file    Inability: Not on file  . Transportation needs    Medical: Not on file    Non-medical: Not on file  Tobacco Use  . Smoking status: Former Smoker    Packs/day: 0.50    Years: 20.00    Pack years: 10.00    Quit date: 01/15/1998    Years since quitting: 21.1  . Smokeless tobacco: Never Used  Substance and Sexual Activity  . Alcohol use: No  . Drug use: No  . Sexual activity: Not on file  Lifestyle  . Physical activity    Days per week: Not on file    Minutes per session: Not on file  . Stress: Not on file  Relationships  . Social Herbalist on phone: Not on file    Gets together: Not on file    Attends religious service: Not on file    Active member of club or organization: Not on file    Attends meetings of clubs or organizations: Not on file    Relationship status: Not on file  Other Topics Concern  . Not on file  Social History Narrative   Lives alone   Caffeine use: none   Family History  Problem Relation Age of Onset  . Tuberculosis Father 76  . Hypertension Mother   . Heart attack Mother        Older than 6.  . Alzheimer's disease Sister   . Parkinson's disease Sister    Scheduled Meds: . Chlorhexidine Gluconate Cloth  6 each Topical Daily  . hydrocortisone  25 mg Rectal BID  . levalbuterol  0.63 mg Nebulization TID  . liver oil-zinc oxide   Topical TID  . mouth rinse  15 mL Mouth  Rinse BID  . nystatin cream   Topical BID  . pantoprazole (PROTONIX) IV  40 mg Intravenous Q24H   Continuous Infusions: . sodium chloride 100 mL/hr at 03/16/19 1444  . fluconazole (DIFLUCAN) IV Stopped (03/15/19 2353)  . piperacillin-tazobactam (ZOSYN)  IV 3.375 g (03/16/19 1307)   PRN Meds:.acetaminophen **OR** acetaminophen, bisacodyl, butamben-tetracaine-benzocaine, haloperidol lactate, ipratropium-albuterol, lip balm, metoprolol tartrate, morphine injection, ondansetron **OR**  ondansetron (ZOFRAN) IV Medications Prior to Admission:  Prior to Admission medications   Medication Sig Start Date End Date Taking? Authorizing Provider  acetaminophen (TYLENOL) 500 MG tablet Take 500 mg by mouth every 6 (six) hours as needed for mild pain.    Yes [provider]  B Complex-C (B-COMPLEX WITH VITAMIN C) tablet Take 1 tablet by mouth daily.   Yes [provider]  Cholecalciferol (VITAMIN D3) 1000 units CAPS Take 1,000 Units by mouth daily.    Yes [provider]  donepezil (ARICEPT) 10 MG tablet Take 10 mg by mouth at bedtime.   Yes [provider]  memantine (NAMENDA) 10 MG tablet Take 10 mg by mouth 2 (two) times daily.   Yes [provider]  Multiple Vitamins-Minerals (MULTIVITAMIN WITH MINERALS) tablet Take 1 tablet by mouth daily.   Yes [provider]  Omega-3 Fatty Acids (FISH OIL) 1000 MG CAPS Take 1,000 mg by mouth daily.    Yes [provider]  vitamin B-12 (CYANOCOBALAMIN) 100 MCG tablet Take 100 mcg by mouth daily.   Yes [provider]  vitamin E 100 UNIT capsule Take 100 Units by mouth daily.   Yes [provider]  aspirin EC 325 MG tablet Take 1 tablet (325 mg total) by mouth daily. Patient not taking: Reported on 03/04/2019 01/14/17   Melvenia Beam, MD   No Known Allergies Review of Systems + confusion and agitation.   Physical Exam Confused and gets agitated easily Coarse breath sounds S1 S2  Abdomen is not distended Patient is weak, frail appearing Appears chronically ill No edema  Vital Signs: BP (!) 128/52   Pulse (!) 49   Temp 98 F (36.7 C) (Oral)   Resp (!) 31   Ht 5' 2.01" (1.575 m)   Wt 80.1 kg   SpO2 99%   BMI 32.29 kg/m  Pain Scale: PAINAD   Pain Score: Asleep   SpO2: SpO2: 99 % O2 Device:SpO2: 99 % O2 Flow Rate: .O2 Flow Rate (L/min): 8 L/min  IO: Intake/output summary:   Intake/Output Summary (Last 24 hours) at 03/16/2019 1513 Last data filed at 03/16/2019 1400 Gross per 24 hour  Intake 1030.46 ml  Output 250 ml  Net 780.46 ml    LBM: Last BM Date: 03/16/19 Baseline Weight: Weight: 80.1 kg Most recent weight: Weight: 80.1 kg     Palliative Assessment/Data:     Time In: 1430 Time Out: 1530  Time Total: 60 min  Greater than 50%  of this time was spent counseling and coordinating care related to the above assessment and plan.  Signed by: Loistine Chance, MD  [phone]  Please contact Palliative Medicine Team phone at [phone] for questions and concerns.  For individual provider: See Shea Evans

## 2019-03-16 NOTE — Progress Notes (Signed)
PROGRESS NOTE    Lynn Hardy  ZMO:[id number] DOB: Jul 31, 1931 DOA: 03/04/2019 PCP: Merrilee Seashore, MD    Brief Narrative:  85 year old female with history of dementia and paroxysmal atrial fibrillation on full dose aspirin,who presents with rectal bleeding. Denies any gastrointestinal symptoms, denies any orthostatic symptoms. History is limited due to cognitive impairment. On her initial physical examination blood pressure 119/55, heart rate 62, respiratory rate 19, oxygen saturation 100%. She has mild pallor, her lungs are clear to auscultation bilaterally, heart S1-S2 present and rhythmic, the abdomen is soft nontender, no lower extremity edema. Sodium 134, potassium 3.5, chloride 104, bicarb 25, glucose 140, BUN 36, creatinine 1.36, AST 27, ALT 31, troponin I 0.04, white count 8.7, hemoglobin 15.2, hematocrit 45.5, platelets 137,urinalysis negative for infection, 6-10 red cells, 6-10 white cells. Hemoccult test positive.CT of the abdomen with no acute findings, markeddistention of the urinary bladder. Large hiatal hernia. Left colonic diverticulosis without diverticulitis. Her chest x-ray had hyperinflation but no infiltrates. Since admission, no further bleeding, Hgb has been stable. Pt has developed progressive agitation, required physical and chemical restrains. She has been found to be very weak and deconditioned, plan to transfer to SNF.    Today, patient still confused with intermittent periods of agitation/restlessness.  Spoke to daughter today who confirmed DNR status.  Palliative consulted for goals of care discussion.    Assessment & Plan:   Principal Problem:   GI bleed Active Problems:   Dementia (HCC)   Rectal bleeding   AF (paroxysmal atrial fibrillation) (HCC)   Diverticulosis   Rectal bleeding due to diverticulosis in the setting of anticoagulation with full dose aspirin Continue topical steroids for hemorrhoids. No further bleeding,  patient did not require any PRBC transfusion  Sepsis likely 2/2 aspiration pneumonia Currently afebrile, now with resolved leukocytosis Lactic acidosis 2.6-->1.8 BC x2 NGTD Repeat chest x-ray showed new right upper lobe and left lower lobe airspace disease suspicious for multifocal pneumonia IV Zosyn SLP on board recommend n.p.o. for now, aspiration precautions  Acute hypoxic and hypercarbic respiratory failure 2/2 aspiration pneumonia Still requiring supplemental O2 ABG showed initially significant respiratory acidosis, repeat improved Chest x-ray as above Continue supplemental oxygen, PRN BiPAP IV Zosyn  AKI on chronic kidney disease 3b/ urinary retention/oliguria Cr trending downwards, still with poor UO Continue IV fluids, strict I&O Continue foley catheter due to urinary retention and also for healing perineal ulcers Need Urology outpatient follow up for voiding trial Daily BMP  UTI Afebrile UA with positive nitrites, leukocytes, WBC UC >100,000 E.coli IV Zosyn, DC IV ceftriaxone  Severe constipation KUB showed moderate to large stool burden Laxative as needed  Paroxysmal atrial fibrillation Currently rate uncontrolled, HR in the 110s IV metoprolol as needed, SBP tends to be on the soft side, if persistent will start diltiazem drip pending BP Not a candidate for anticoagulation due to fall risk and dementia  Dementiawith new delirium Progressive agitation and confusion, trying to pull lines, likely due to above UTI in addition to constipation Management as above Hold olanzapine, haldol prn Monitor closely   Perineal ulcers Painful ulcers in the perineal region with cutaneous fungal infection Continue local antifungal cream, topical zinc oxide, continue pain control WOC consulted: Rec starting diflucan and keeping foley in for the next few days to allow ulcers heal as pt is incontinent     Obesity Calculated BMI is 32 Lifestyle modification  Goals of care  Palliative consulted Spoke to daughter who confirmed DNR status     DVT  prophylaxis:scd Code Status:DNR Family Communication:Spoke to daughter over the phone Disposition Plan/ discharge barriers:placement at SNF  Body mass index is 32.29 kg/m. Malnutrition Type:      Malnutrition Characteristics:      Nutrition Interventions:     RN Pressure Injury Documentation:     Consultants:   None  Procedures:   None  Antimicrobials:  Ceftriaxone-->Zosyn   Diflucan- (antifungal)   Objective: Vitals:   03/16/19 0807 03/16/19 1000 03/16/19 1145 03/16/19 1300  BP:  (!) 176/136  (!) 128/52  Pulse: (!) 103 (!) 129  (!) 49  Resp: 20 (!) 29  (!) 31  Temp:   98 F (36.7 C)   TempSrc:   Oral   SpO2: 100% (!) 80%  99%  Weight:      Height:        Intake/Output Summary (Last 24 hours) at 03/16/2019 1353 Last data filed at 03/16/2019 0600 Gross per 24 hour  Intake 975 ml  Output 250 ml  Net 725 ml   Filed Weights   03/06/19 0723  Weight: 80.1 kg    Examination:   General:  Confused, restless, with periods of agitation  Cardiovascular: S1, S2 present  Respiratory:  Poor respiratory effort  Abdomen: Soft, nontender, nondistended, bowel sounds present  Musculoskeletal: No bilateral pedal edema noted  Skin:  Perineum with noted small ulcers  Psychiatry:  Confused   Data Reviewed: I have personally reviewed following labs and imaging studies  CBC: Recent Labs  Lab 03/13/19 0543 03/14/19 0142 03/14/19 1945 03/15/19 0222 03/16/19 0657  WBC 8.8 17.4* 13.2* 14.0* 10.5  NEUTROABS 5.9 15.3* 11.4* 11.1* 9.5*  HGB 13.2 14.4 13.8 13.5 12.9  HCT 40.6 45.3 43.1 43.0 42.0  MCV 93.3 97.2 94.1 95.3 99.1  PLT 121* 168 151 170 379*   Basic Metabolic Panel: Recent Labs  Lab 03/13/19 0543 03/14/19 0142 03/14/19 1945 03/15/19 0222 03/16/19 0216  NA 142 145 144 143 147*  K 4.0 3.9 4.5 4.4 4.3  CL 108 106 109 111 115*  CO2 25 26 24  18* 21*   GLUCOSE 124* 253* 160* 146* 141*  BUN 13 13 23  24* 21  CREATININE 0.96 1.30* 1.47* 1.33* 1.05*  CALCIUM 8.7* 9.0 8.5* 8.4* 8.4*   GFR: Estimated Creatinine Clearance: 38.4 mL/min (A) (by C-G formula based on SCr of 1.05 mg/dL (H)). Liver Function Tests: No results for input(s): AST, ALT, ALKPHOS, BILITOT, PROT, ALBUMIN in the last 168 hours. No results for input(s): LIPASE, AMYLASE in the last 168 hours. No results for input(s): AMMONIA in the last 168 hours. Coagulation Profile: No results for input(s): INR, PROTIME in the last 168 hours. Cardiac Enzymes: No results for input(s): CKTOTAL, CKMB, CKMBINDEX, TROPONINI in the last 168 hours. BNP (last 3 results) No results for input(s): PROBNP in the last 8760 hours. HbA1C: No results for input(s): HGBA1C in the last 72 hours. CBG: Recent Labs  Lab 03/15/19 2014 03/15/19 2342 03/16/19 0335 03/16/19 0755 03/16/19 1138  GLUCAP 147* 132* 150* 157* 134*   Lipid Profile: No results for input(s): CHOL, HDL, LDLCALC, TRIG, CHOLHDL, LDLDIRECT in the last 72 hours. Thyroid Function Tests: No results for input(s): TSH, T4TOTAL, FREET4, T3FREE, THYROIDAB in the last 72 hours. Anemia Panel: No results for input(s): VITAMINB12, FOLATE, FERRITIN, TIBC, IRON, RETICCTPCT in the last 72 hours.    Radiology Studies: I have reviewed all of the imaging during this hospital visit personally     Scheduled Meds: . Chlorhexidine Gluconate Cloth  6  each Topical Daily  . hydrocortisone  25 mg Rectal BID  . levalbuterol  0.63 mg Nebulization TID  . liver oil-zinc oxide   Topical TID  . mouth rinse  15 mL Mouth Rinse BID  . nystatin cream   Topical BID  . pantoprazole (PROTONIX) IV  40 mg Intravenous Q24H   Continuous Infusions: . sodium chloride 100 mL/hr (03/16/19 1032)  . fluconazole (DIFLUCAN) IV Stopped (03/15/19 2353)  . piperacillin-tazobactam (ZOSYN)  IV 3.375 g (03/16/19 1307)     LOS: 11 days        Alma Friendly,  MD

## 2019-03-16 NOTE — Progress Notes (Signed)
  Speech Language Pathology Treatment: Dysphagia  Patient Details Name: Chenille Toor MRN: [id number] DOB: 1930/12/04 Today's Date: 03/16/2019 Time: 6314-9702 SLP Time Calculation (min) (ACUTE ONLY): 20 min  Assessment / Plan / Recommendation Clinical Impression  Patient seen to address dysphagia goals and determine readiness for initiating PO diet. Patient was alert but kept eyes closed most of the session. She exhibited appropriate oral preparatory phase with straw and cups sips and bites of puree. Delayed oral transit noted with purees and delayed swallow initiation with thin liquids and nectar thick liquids. Patient exhibited some laryngeal pumping and multiple swallows with all consistencies. Delayed cough with thin liquids with audible secretions at approximately chest level, but patient unable to move secretions high enough for expectoration. Plan for MBS today as patient is exhibiting clear and consistent s/s of aspiration and current admission was secondary to coughing and choking on pill (crushed in applesauce) intake followed by difficulty with breathing.    HPI HPI: Chloeanne Poteet is a 85 y.o. female with medical history significant of dementia, atrial fibrillation, CVA and dementia who presents from home on 6/17 due to rectal bleeding. Dx with GI bleed due to diverticulosis. On 6/26, patient no documented difficulty swallowing but with an episode of coughing and choking following pill intake (crushed in applesauce) followed by difficulty breathing. Transferred to ICU. Patient then with vomiting and a probably aspiration event per RN notes. Rapid respopnse called, NT suctioned large amounts of gastric contents and partial pill fragments.       SLP Plan  Continue with current plan of care;MBS       Recommendations  Diet recommendations: NPO Medication Administration: Via alternative means                Oral Care Recommendations: Oral care QID Follow up  Recommendations: Skilled Nursing facility;24 hour supervision/assistance SLP Visit Diagnosis: Dysphagia, oropharyngeal phase (R13.12) Plan: Continue with current plan of care;MBS       Sonia Baller, MA, [facility] Acute Rehab Pager: [phone]

## 2019-03-17 ENCOUNTER — Inpatient Hospital Stay (HOSPITAL_COMMUNITY): Payer: Medicare Other

## 2019-03-17 DIAGNOSIS — Z7189 Other specified counseling: Secondary | ICD-10-CM

## 2019-03-17 DIAGNOSIS — Z515 Encounter for palliative care: Secondary | ICD-10-CM

## 2019-03-17 DIAGNOSIS — T17908A Unspecified foreign body in respiratory tract, part unspecified causing other injury, initial encounter: Secondary | ICD-10-CM

## 2019-03-17 LAB — CBC WITH DIFFERENTIAL/PLATELET
Abs Immature Granulocytes: 0.07 10*3/uL (ref 0.00–0.07)
Basophils Absolute: 0 10*3/uL (ref 0.0–0.1)
Basophils Relative: 0 %
Eosinophils Absolute: 0 10*3/uL (ref 0.0–0.5)
Eosinophils Relative: 0 %
HCT: 40.4 % (ref 36.0–46.0)
Hemoglobin: 12.8 g/dL (ref 12.0–15.0)
Immature Granulocytes: 1 %
Lymphocytes Relative: 10 %
Lymphs Abs: 1.2 10*3/uL (ref 0.7–4.0)
MCH: 30.7 pg (ref 26.0–34.0)
MCHC: 31.7 g/dL (ref 30.0–36.0)
MCV: 96.9 fL (ref 80.0–100.0)
Monocytes Absolute: 0.8 10*3/uL (ref 0.1–1.0)
Monocytes Relative: 7 %
Neutro Abs: 10.3 10*3/uL — ABNORMAL HIGH (ref 1.7–7.7)
Neutrophils Relative %: 82 %
Platelets: 160 10*3/uL (ref 150–400)
RBC: 4.17 MIL/uL (ref 3.87–5.11)
RDW: 14.5 % (ref 11.5–15.5)
WBC: 12.5 10*3/uL — ABNORMAL HIGH (ref 4.0–10.5)
nRBC: 0 % (ref 0.0–0.2)

## 2019-03-17 LAB — BASIC METABOLIC PANEL
Anion gap: 10 (ref 5–15)
Anion gap: 12 (ref 5–15)
Anion gap: 11 (ref 5–15)
BUN: 23 mg/dL (ref 8–23)
BUN: 23 mg/dL (ref 8–23)
BUN: 28 mg/dL — ABNORMAL HIGH (ref 8–23)
CO2: 21 mmol/L — ABNORMAL LOW (ref 22–32)
CO2: 19 mmol/L — ABNORMAL LOW (ref 22–32)
CO2: 25 mmol/L (ref 22–32)
Calcium: 9 mg/dL (ref 8.9–10.3)
Calcium: 8.8 mg/dL — ABNORMAL LOW (ref 8.9–10.3)
Calcium: 8.7 mg/dL — ABNORMAL LOW (ref 8.9–10.3)
Chloride: 120 mmol/L — ABNORMAL HIGH (ref 98–111)
Chloride: 120 mmol/L — ABNORMAL HIGH (ref 98–111)
Chloride: 113 mmol/L — ABNORMAL HIGH (ref 98–111)
Creatinine, Ser: 1 mg/dL (ref 0.44–1.00)
Creatinine, Ser: 1.01 mg/dL — ABNORMAL HIGH (ref 0.44–1.00)
Creatinine, Ser: 1.22 mg/dL — ABNORMAL HIGH (ref 0.44–1.00)
GFR calc Af Amer: 59 mL/min — ABNORMAL LOW (ref >60)
GFR calc Af Amer: 59 mL/min — ABNORMAL LOW (ref >60)
GFR calc Af Amer: 47 mL/min — ABNORMAL LOW (ref >60)
GFR calc non Af Amer: 51 mL/min — ABNORMAL LOW (ref >60)
GFR calc non Af Amer: 51 mL/min — ABNORMAL LOW (ref >60)
GFR calc non Af Amer: 40 mL/min — ABNORMAL LOW (ref >60)
Glucose, Bld: 166 mg/dL — ABNORMAL HIGH (ref 70–99)
Glucose, Bld: 199 mg/dL — ABNORMAL HIGH (ref 70–99)
Glucose, Bld: 270 mg/dL — ABNORMAL HIGH (ref 70–99)
Potassium: 4 mmol/L (ref 3.5–5.1)
Potassium: 3.8 mmol/L (ref 3.5–5.1)
Potassium: 3 mmol/L — ABNORMAL LOW (ref 3.5–5.1)
Sodium: 151 mmol/L — ABNORMAL HIGH (ref 135–145)
Sodium: 151 mmol/L — ABNORMAL HIGH (ref 135–145)
Sodium: 149 mmol/L — ABNORMAL HIGH (ref 135–145)

## 2019-03-17 LAB — GLUCOSE, CAPILLARY
Glucose-Capillary: 147 mg/dL — ABNORMAL HIGH (ref 70–99)
Glucose-Capillary: 156 mg/dL — ABNORMAL HIGH (ref 70–99)
Glucose-Capillary: 165 mg/dL — ABNORMAL HIGH (ref 70–99)
Glucose-Capillary: 169 mg/dL — ABNORMAL HIGH (ref 70–99)
Glucose-Capillary: 198 mg/dL — ABNORMAL HIGH (ref 70–99)
Glucose-Capillary: 219 mg/dL — ABNORMAL HIGH (ref 70–99)
Glucose-Capillary: 252 mg/dL — ABNORMAL HIGH (ref 70–99)

## 2019-03-17 LAB — LACTIC ACID, PLASMA: Lactic Acid, Venous: 1.8 mmol/L (ref 0.5–1.9)

## 2019-03-17 MED ORDER — DEXTROSE 5 % IV SOLN
INTRAVENOUS | Status: DC
  Administered 2019-03-17 – 2019-03-18 (×2): via INTRAVENOUS

## 2019-03-17 MED ORDER — LACTATED RINGERS IV BOLUS
500.0000 mL | Freq: Once | INTRAVENOUS | Status: AC
  Administered 2019-03-17: 500 mL via INTRAVENOUS

## 2019-03-17 MED ORDER — FLUCONAZOLE 100 MG PO TABS
100.0000 mg | ORAL_TABLET | Freq: Every day | ORAL | Status: DC
  Administered 2019-03-17: 100 mg via ORAL
  Filled 2019-03-17: qty 1

## 2019-03-17 MED ORDER — DONEPEZIL HCL 10 MG PO TABS
10.0000 mg | ORAL_TABLET | Freq: Every day | ORAL | Status: DC
  Administered 2019-03-17: 10 mg via ORAL
  Filled 2019-03-17: qty 1

## 2019-03-17 MED ORDER — METOPROLOL TARTRATE 5 MG/5ML IV SOLN
2.5000 mg | INTRAVENOUS | Status: DC | PRN
  Administered 2019-03-17 – 2019-03-18 (×2): 2.5 mg via INTRAVENOUS
  Filled 2019-03-17: qty 5

## 2019-03-17 MED ORDER — METOPROLOL TARTRATE 5 MG/5ML IV SOLN
5.0000 mg | Freq: Once | INTRAVENOUS | Status: AC
  Administered 2019-03-17: 5 mg via INTRAVENOUS
  Filled 2019-03-17: qty 5

## 2019-03-17 MED ORDER — FUROSEMIDE 10 MG/ML IJ SOLN
40.0000 mg | Freq: Once | INTRAMUSCULAR | Status: AC
  Administered 2019-03-17: 40 mg via INTRAVENOUS
  Filled 2019-03-17: qty 4

## 2019-03-17 MED ORDER — MEMANTINE HCL 5 MG PO TABS
10.0000 mg | ORAL_TABLET | Freq: Two times a day (BID) | ORAL | Status: DC
  Administered 2019-03-17 (×2): 10 mg via ORAL
  Filled 2019-03-17 (×2): qty 2

## 2019-03-17 MED ORDER — HYDRALAZINE HCL 20 MG/ML IJ SOLN
10.0000 mg | Freq: Three times a day (TID) | INTRAMUSCULAR | Status: DC | PRN
  Administered 2019-03-17: 10 mg via INTRAVENOUS
  Filled 2019-03-17: qty 1

## 2019-03-17 MED ORDER — PANTOPRAZOLE SODIUM 40 MG PO PACK
40.0000 mg | PACK | Freq: Every day | ORAL | Status: DC
  Administered 2019-03-17: 40 mg
  Filled 2019-03-17: qty 20

## 2019-03-17 MED ORDER — SODIUM CHLORIDE 0.9 % IV BOLUS: 500.0000 mL | Freq: Once | INTRAVENOUS | Status: DC

## 2019-03-17 MED ORDER — SODIUM CHLORIDE 0.9 % IV BOLUS: 250.0000 mL | Freq: Once | INTRAVENOUS | Status: DC

## 2019-03-17 MED ORDER — HYDRALAZINE HCL 20 MG/ML IJ SOLN: 10.0000 mg | Freq: Once | INTRAMUSCULAR | Status: DC

## 2019-03-17 NOTE — Progress Notes (Signed)
Pharmacy: IV to PO  Fluconazole and PPI changed from IV to PO - crushed w/ puree  Eudelia Bunch, Pharm.D 03/17/2019 11:09 AM

## 2019-03-17 NOTE — Progress Notes (Addendum)
PROGRESS NOTE    Lynn Hardy  JWJ:[id number] DOB: [birth year] DOA: 03/04/2019 PCP: Merrilee Seashore, MD    Brief Narrative:  85 year old female with history of dementia and paroxysmal atrial fibrillation on full dose aspirin,who presents with rectal bleeding. Denies any gastrointestinal symptoms, denies any orthostatic symptoms. History is limited due to cognitive impairment. On her initial physical examination blood pressure 119/55, heart rate 62, respiratory rate 19, oxygen saturation 100%. She has mild pallor, her lungs are clear to auscultation bilaterally, heart S1-S2 present and rhythmic, the abdomen is soft nontender, no lower extremity edema. Sodium 134, potassium 3.5, chloride 104, bicarb 25, glucose 140, BUN 36, creatinine 1.36, AST 27, ALT 31, troponin I 0.04, white count 8.7, hemoglobin 15.2, hematocrit 45.5, platelets 137,urinalysis negative for infection, 6-10 red cells, 6-10 white cells. Hemoccult test positive.CT of the abdomen with no acute findings, markeddistention of the urinary bladder. Large hiatal hernia. Left colonic diverticulosis without diverticulitis. Her chest x-ray had hyperinflation but no infiltrates. Since admission, no further bleeding, Hgb has been stable. Pt has developed progressive agitation, required physical and chemical restrains. She has been found to be very weak and deconditioned, plan to transfer to SNF.    Today, patient still appears lethargic, no longer agitated/restless.    Assessment & Plan:   Principal Problem:   GI bleed Active Problems:   Dementia (HCC)   Rectal bleeding   AF (paroxysmal atrial fibrillation) (HCC)   Diverticulosis   Aspiration into airway   Palliative care by specialist   Goals of care, counseling/discussion   Rectal bleeding due to diverticulosis in the setting of anticoagulation with full dose aspirin Continue topical steroids for hemorrhoids. No further bleeding, patient did not  require any PRBC transfusion  Sepsis likely 2/2 aspiration pneumonia Currently afebrile, now with resolved leukocytosis Lactic acidosis 2.6-->1.8 BC x2 NGTD Repeat chest x-ray showed new right upper lobe and left lower lobe airspace disease suspicious for multifocal pneumonia, repeat showed stable multifocal pneumonia IV Zosyn SLP on board, aspiration precautions  Acute hypoxic and hypercarbic respiratory failure 2/2 aspiration pneumonia Still requiring supplemental O2 ABG showed initially significant respiratory acidosis, repeat improved Chest x-ray as above Continue supplemental oxygen, PRN BiPAP IV Zosyn  AKI on chronic kidney disease 3b/ urinary retention/oliguria Cr trending downwards, still with poor UO Continue IV fluids, strict I&O Continue foley catheter due to urinary retention and also for healing perineal ulcers Need Urology outpatient follow up for voiding trial Daily BMP  Hypernatremia Poor oral intake Change IV fluids to D5 water Daily BMP  CAUTI Afebrile UA with positive nitrites, leukocytes, WBC UC >100,000 E.coli IV Zosyn, DC IV ceftriaxone  Severe constipation KUB showed moderate to large stool burden Laxative as needed  Paroxysmal atrial fibrillation Currently rate uncontrolled, HR in the 110s IV metoprolol as needed, SBP tends to be on the soft side, if persistent will start diltiazem drip pending BP Not a candidate for anticoagulation due to fall risk and dementia  Dementiawith new delirium Progressive agitation and confusion, trying to pull lines, likely due to above UTI in addition to constipation Management as above Hold olanzapine, haldol prn Monitor closely   Perineal ulcers Painful ulcers in the perineal region with cutaneous fungal infection Continue local antifungal cream, topical zinc oxide, continue pain control WOC consulted: Rec starting diflucan and keeping foley in for the next few days to allow ulcers heal as pt is  incontinent     Obesity Calculated BMI is 32 Lifestyle modification  Goals of care Palliative  consulted Spoke to daughter who confirmed DNR status     DVT prophylaxis:scd Code Status:DNR Family Communication:Spoke to daughter over the phone Disposition Plan/ discharge barriers:placement at [facility]  Body mass index is 32.29 kg/m. Malnutrition Type:      Malnutrition Characteristics:      Nutrition Interventions:     RN Pressure Injury Documentation:     Consultants:   Palliative  Procedures:   None  Antimicrobials:  Ceftriaxone-->Zosyn   Diflucan- (antifungal)   Objective: Vitals:   03/17/19 1144 03/17/19 1340 03/17/19 1420 03/17/19 1428  BP:  (!) 148/111 (!) 143/69   Pulse:  99 (!) 103   Resp:  (!) 33 (!) 28   Temp: (!) 97.5 F (36.4 C)     TempSrc: Axillary     SpO2:  95% 98% 98%  Weight:      Height:        Intake/Output Summary (Last 24 hours) at 03/17/2019 1506 Last data filed at 03/17/2019 1424 Gross per 24 hour  Intake 1378.68 ml  Output 2050 ml  Net -671.32 ml   Filed Weights   03/06/19 0723  Weight: 80.1 kg    Examination:   General:  Lethargic  Cardiovascular: S1, S2 present  Respiratory:  Poor respiratory effort  Abdomen: Soft, nontender, nondistended, bowel sounds present  Musculoskeletal: No bilateral pedal edema noted  Skin:  Perineum was noted small ulcers  Psychiatry:  Unable to assess   Data Reviewed: I have personally reviewed following labs and imaging studies  CBC: Recent Labs  Lab 03/14/19 0142 03/14/19 1945 03/15/19 0222 03/16/19 0657 03/17/19 0251  WBC 17.4* 13.2* 14.0* 10.5 12.5*  NEUTROABS 15.3* 11.4* 11.1* 9.5* 10.3*  HGB 14.4 13.8 13.5 12.9 12.8  HCT 45.3 43.1 43.0 42.0 40.4  MCV 97.2 94.1 95.3 99.1 96.9  PLT 168 151 170 149* 875   Basic Metabolic Panel: Recent Labs  Lab 03/14/19 1945 03/15/19 0222 03/16/19 0216 03/17/19 0251 03/17/19 0649  NA 144 143 147* 151* 151*  K  4.5 4.4 4.3 4.0 3.8  CL 109 111 115* 120* 120*  CO2 24 18* 21* 21* 19*  GLUCOSE 160* 146* 141* 166* 199*  BUN 23 24* 21 23 23   CREATININE 1.47* 1.33* 1.05* 1.00 1.01*  CALCIUM 8.5* 8.4* 8.4* 9.0 8.8*   GFR: Estimated Creatinine Clearance: 39.9 mL/min (A) (by C-G formula based on SCr of 1.01 mg/dL (H)). Liver Function Tests: No results for input(s): AST, ALT, ALKPHOS, BILITOT, PROT, ALBUMIN in the last 168 hours. No results for input(s): LIPASE, AMYLASE in the last 168 hours. No results for input(s): AMMONIA in the last 168 hours. Coagulation Profile: No results for input(s): INR, PROTIME in the last 168 hours. Cardiac Enzymes: No results for input(s): CKTOTAL, CKMB, CKMBINDEX, TROPONINI in the last 168 hours. BNP (last 3 results) No results for input(s): PROBNP in the last 8760 hours. HbA1C: No results for input(s): HGBA1C in the last 72 hours. CBG: Recent Labs  Lab 03/16/19 2007 03/17/19 0025 03/17/19 0347 03/17/19 0752 03/17/19 1113  GLUCAP 164* 147* 156* 169* 219*   Lipid Profile: No results for input(s): CHOL, HDL, LDLCALC, TRIG, CHOLHDL, LDLDIRECT in the last 72 hours. Thyroid Function Tests: No results for input(s): TSH, T4TOTAL, FREET4, T3FREE, THYROIDAB in the last 72 hours. Anemia Panel: No results for input(s): VITAMINB12, FOLATE, FERRITIN, TIBC, IRON, RETICCTPCT in the last 72 hours.    Radiology Studies: I have reviewed all of the imaging during this hospital visit personally  Scheduled Meds: . Chlorhexidine Gluconate Cloth  6 each Topical Daily  . donepezil  10 mg Oral QHS  . fluconazole  100 mg Oral q1800  . hydrocortisone  25 mg Rectal BID  . levalbuterol  0.63 mg Nebulization TID  . liver oil-zinc oxide   Topical TID  . mouth rinse  15 mL Mouth Rinse BID  . memantine  10 mg Oral BID  . mupirocin ointment  1 application Nasal BID  . nystatin cream   Topical BID  . pantoprazole sodium  40 mg Per Tube q1800   Continuous Infusions: .  dextrose 100 mL/hr at 03/17/19 0921  . piperacillin-tazobactam (ZOSYN)  IV 3.375 g (03/17/19 1307)     LOS: 12 days        Alma Friendly, MD

## 2019-03-17 NOTE — Progress Notes (Signed)
Physical Therapy Treatment Patient Details Name: Lynn Hardy MRN: [id number] DOB: 03/27/1931 Today's Date: 03/17/2019    History of Present Illness 85 year old female with history of dementia and paroxysmal atrial fibrillation on full dose aspirin, who presents with rectal bleeding    PT Comments    Pt assisted with sitting EOB however requiring max assist for bed mobility and occasional mod assist to maintain upright sitting posture.  Pt not consistently following commands and with high RR so did not perform standing.  Continue to recommend SNF upon d/c.   Follow Up Recommendations  SNF;Supervision/Assistance - 24 hour     Equipment Recommendations  Rolling walker with 5" wheels    Recommendations for Other Services       Precautions / Restrictions Precautions Precautions: Fall Precaution Comments: monitor vitals  BP presession: 147/82 mmHg, BP sitting EOB: 167/99 mmHg RR 22-40 during session Pt remained on 5L O2 [place]   Mobility  Bed Mobility Overal bed mobility: Needs Assistance Bed Mobility: Supine to Sit;Sit to Supine     Supine to sit: Max assist;+2 for physical assistance Sit to supine: +2 for physical assistance;Max assist   General bed mobility comments: pt attempting to assist however presents with generalized weakness, assist for upper and lower body, utilized bed pad for assisting pt  Transfers                 General transfer comment: NT for safety  Ambulation/Gait                 Stairs             Wheelchair Mobility    Modified Rankin (Stroke Patients Only)       Balance Overall balance assessment: Needs assistance;History of Falls Sitting-balance support: Feet unsupported;Bilateral upper extremity supported Sitting balance-[name]: Poor Sitting balance - Comments: pt requiring UE support, also occasional forward lean requiring mod assist for correction (avoid falling out of bed)                                     Cognition Arousal/Alertness: Awake/alert Behavior During Therapy: WFL for tasks assessed/performed Overall Cognitive Status: History of cognitive impairments - at baseline                                 General Comments: orientated to self, agreeable to mobilize, decreased insight      Exercises      General Comments        Pertinent Vitals/Pain Pain Assessment: Faces Faces Pain Scale: No hurt Pain Intervention(s): Repositioned    Home Living                      Prior Function            PT Goals (current goals can now be found in the care plan section) Acute Rehab PT Goals PT Goal Formulation: Patient unable to participate in goal setting Time For Goal Achievement: 03/31/19 Potential to Achieve Goals: Fair Progress towards PT goals: Progressing toward goals    Frequency    Min 2X/week      PT Plan Current plan remains appropriate;Frequency needs to be updated    Co-evaluation              AM-PAC PT "6 Clicks" Mobility   Outcome Measure  Help needed turning from your back to your side while in a flat bed without using bedrails?: Total Help needed moving from lying on your back to sitting on the side of a flat bed without using bedrails?: Total Help needed moving to and from a bed to a chair (including a wheelchair)?: Total Help needed standing up from a chair using your arms (e.g., wheelchair or bedside chair)?: Total Help needed to walk in hospital room?: Total Help needed climbing 3-5 steps with a railing? : Total 6 Click Score: 6    End of Session   Activity Tolerance: Patient tolerated treatment well Patient left: in bed;with call bell/phone within reach;with bed alarm set Nurse Communication: Mobility status PT Visit Diagnosis: Difficulty in walking, not elsewhere classified (R26.2);Muscle weakness (generalized) (M62.81)     Time: 9563-8756 PT Time Calculation (min) (ACUTE ONLY): 18  min  Charges:  $Therapeutic Activity: 8-22 mins                    Carmelia Bake, PT, DPT Acute Rehabilitation Services Office: [phone] Pager: [phone]  Trena Platt 03/17/2019, 12:57 PM

## 2019-03-17 NOTE — Progress Notes (Signed)
Notified NP, Schorr on call in regards to patient's increased BP, HR, and Resp rate. Current VS's: 178/115, MAP 136, 125, 37. Awaiting call back.

## 2019-03-17 NOTE — Progress Notes (Signed)
Daily Progress Note   Patient Name: Lynn Hardy       Date: 03/17/2019 DOB: 1931/08/09  Age: 85 y.o. MRN#: [id number] Attending Physician: Alma Friendly, MD Primary Care Physician: Merrilee Seashore, MD Admit Date: 03/04/2019  Reason for Consultation/Follow-up: Establishing goals of care  Subjective: I saw and examined [name] today.  She is awake but does not follow commmands or participate in conversation.  Documented 70% of breakfast.    I called and spoke with her daughter via phone. See below.  Length of Stay: 12  Current Medications: Scheduled Meds:   Chlorhexidine Gluconate Cloth  6 each Topical Daily   donepezil  10 mg Oral QHS   fluconazole  100 mg Oral q1800   hydrocortisone  25 mg Rectal BID   levalbuterol  0.63 mg Nebulization TID   liver oil-zinc oxide   Topical TID   mouth rinse  15 mL Mouth Rinse BID   memantine  10 mg Oral BID   mupirocin ointment  1 application Nasal BID   nystatin cream   Topical BID   pantoprazole sodium  40 mg Per Tube q1800    Continuous Infusions:  dextrose 100 mL/hr at 03/17/19 0921   piperacillin-tazobactam (ZOSYN)  IV Stopped (03/17/19 1030)    PRN Meds: acetaminophen **OR** acetaminophen, bisacodyl, butamben-tetracaine-benzocaine, haloperidol lactate, hydrALAZINE, ipratropium-albuterol, lip balm, metoprolol tartrate, morphine injection, ondansetron **OR** ondansetron (ZOFRAN) IV  Physical Exam         General: Alert, awake, in no acute distress. Does not participate in conversation. Heart: Tachycardic. No murmur appreciated. Lungs: Fair air movement, Coarse Abdomen: Soft, nontender, nondistended, positive bowel sounds.  Ext: No significant edema Skin: Warm and dry  Vital Signs: BP (!)  147/82 (BP Location: Left Arm)    Pulse (!) 107    Temp (!) 97.5 F (36.4 C) (Axillary)    Resp (!) 33    Ht 5' 2.01" (1.575 m)    Wt 80.1 kg    SpO2 98%    BMI 32.29 kg/m  SpO2: SpO2: 98 % O2 Device: O2 Device: Nasal Cannula O2 Flow Rate: O2 Flow Rate (L/min): 6 L/min  Intake/output summary:   Intake/Output Summary (Last 24 hours) at 03/17/2019 1305 Last data filed at 03/17/2019 1100 Gross per 24 hour  Intake 1434.14 ml  Output 1025  ml  Net 409.14 ml   LBM: Last BM Date: 03/16/19 Baseline Weight: Weight: 80.1 kg Most recent weight: Weight: 80.1 kg       Palliative Assessment/Data:      Patient Active Problem List   Diagnosis Date Noted   Aspiration into airway    Palliative care by specialist    Goals of care, counseling/discussion    GI bleed 03/04/2019   Rectal bleeding 03/04/2019   AF (paroxysmal atrial fibrillation) ([place]) 03/04/2019   Diverticulosis 03/04/2019   Dementia ([place]) 10/16/2016   Morbid obesity ([place]) 04/29/2016   Ankle edema 05/08/2013   HTN (hypertension) 05/08/2013   NSVT (nonsustained ventricular tachycardia) ([place]) 05/08/2013   Ectopic atrial tachycardia ([place]) 05/08/2013    Palliative Care Assessment & Plan   Patient Profile: 85 yo female with a fib, hiatal hernia, dementia who presented with blood per rectum.  Admitted with sepsis, aspiration, AKI on CKD.  Course complicated by confusion.  Started on dysphagia diet after MBS   Assessment: Patient Active Problem List   Diagnosis Date Noted   Aspiration into airway    Palliative care by specialist    Goals of care, counseling/discussion    GI bleed 03/04/2019   Rectal bleeding 03/04/2019   AF (paroxysmal atrial fibrillation) ([place]) 03/04/2019   Diverticulosis 03/04/2019   Dementia ([place]) 10/16/2016   Morbid obesity ([place]) 04/29/2016   Ankle edema 05/08/2013   HTN (hypertension) 05/08/2013   NSVT (nonsustained ventricular tachycardia) ([place]) 05/08/2013   Ectopic  atrial tachycardia ([place]) 05/08/2013    Recommendations/Plan:  DNR/DNI  Patient clear in past about desire for no artificial nutrition/PEG  Noted to be eating fairly well with assistance.  Reviewed clinical course with daughter today and plan for continuation of current care for another 24 hours to hopefully gain better understanding of trajectory.  Initial discussion yesterday was introduction of residential hospice, but she may need SNF with palliative following.  Plan for f/u with daughter tomorrow.   Code Status:    Code Status Orders  (From admission, onward)         Start     Ordered   03/14/19 0944  Do not attempt resuscitation (DNR)  Continuous    Question Answer Comment  In the event of cardiac or respiratory ARREST Do not call a code blue   In the event of cardiac or respiratory ARREST Do not perform Intubation, CPR, defibrillation or ACLS   In the event of cardiac or respiratory ARREST Use medication by any route, position, wound care, and other measures to relive pain and suffering. May use oxygen, suction and manual treatment of airway obstruction as needed for comfort.      03/14/19 0943        Code Status History    Date Active Date Inactive Code Status Order ID Comments User Context   03/04/2019 1745 03/14/2019 0943 Full Code [id number]  Arrien, Jimmy Picket, MD Inpatient   Advance Care Planning Activity       Prognosis:   Unable to determine  Discharge Planning:  To Be Determined- ? SNF  Care plan was discussed with daughter  Thank you for allowing the Palliative Medicine Team to assist in the care of this patient.   Time In: 1240 Time Out: 1310 Total Time 30 Prolonged Time Billed No      Greater than 50%  of this time was spent counseling and coordinating care related to the above assessment and plan.  Micheline Rough, MD  Please contact Palliative  Medicine Team phone at [phone] for questions and concerns.

## 2019-03-18 DIAGNOSIS — K922 Gastrointestinal hemorrhage, unspecified: Secondary | ICD-10-CM

## 2019-03-18 LAB — CBC WITH DIFFERENTIAL/PLATELET
Abs Immature Granulocytes: 0.05 10*3/uL (ref 0.00–0.07)
Basophils Absolute: 0 10*3/uL (ref 0.0–0.1)
Basophils Relative: 0 %
Eosinophils Absolute: 0 10*3/uL (ref 0.0–0.5)
Eosinophils Relative: 0 %
HCT: 37 % (ref 36.0–46.0)
Hemoglobin: 11.8 g/dL — ABNORMAL LOW (ref 12.0–15.0)
Immature Granulocytes: 1 %
Lymphocytes Relative: 13 %
Lymphs Abs: 1.1 10*3/uL (ref 0.7–4.0)
MCH: 30.4 pg (ref 26.0–34.0)
MCHC: 31.9 g/dL (ref 30.0–36.0)
MCV: 95.4 fL (ref 80.0–100.0)
Monocytes Absolute: 0.7 10*3/uL (ref 0.1–1.0)
Monocytes Relative: 8 %
Neutro Abs: 6.8 10*3/uL (ref 1.7–7.7)
Neutrophils Relative %: 78 %
Platelets: 130 10*3/uL — ABNORMAL LOW (ref 150–400)
RBC: 3.88 MIL/uL (ref 3.87–5.11)
RDW: 14.3 % (ref 11.5–15.5)
WBC: 8.7 10*3/uL (ref 4.0–10.5)
nRBC: 0 % (ref 0.0–0.2)

## 2019-03-18 LAB — GLUCOSE, CAPILLARY
Glucose-Capillary: 145 mg/dL — ABNORMAL HIGH (ref 70–99)
Glucose-Capillary: 182 mg/dL — ABNORMAL HIGH (ref 70–99)

## 2019-03-18 LAB — BASIC METABOLIC PANEL
Anion gap: 11 (ref 5–15)
BUN: 25 mg/dL — ABNORMAL HIGH (ref 8–23)
CO2: 26 mmol/L (ref 22–32)
Calcium: 8.6 mg/dL — ABNORMAL LOW (ref 8.9–10.3)
Chloride: 114 mmol/L — ABNORMAL HIGH (ref 98–111)
Creatinine, Ser: 1.13 mg/dL — ABNORMAL HIGH (ref 0.44–1.00)
GFR calc Af Amer: 51 mL/min — ABNORMAL LOW (ref >60)
GFR calc non Af Amer: 44 mL/min — ABNORMAL LOW (ref >60)
Glucose, Bld: 148 mg/dL — ABNORMAL HIGH (ref 70–99)
Potassium: 2.6 mmol/L — CL (ref 3.5–5.1)
Sodium: 151 mmol/L — ABNORMAL HIGH (ref 135–145)

## 2019-03-18 MED ORDER — HALOPERIDOL 1 MG PO TABS: 0.5000 mg | ORAL_TABLET | ORAL | Status: DC | PRN

## 2019-03-18 MED ORDER — GLYCOPYRROLATE 1 MG PO TABS: 1.0000 mg | ORAL_TABLET | ORAL | Status: DC | PRN

## 2019-03-18 MED ORDER — POLYVINYL ALCOHOL 1.4 % OP SOLN
1.0000 [drp] | Freq: Four times a day (QID) | OPHTHALMIC | Status: DC | PRN
  Filled 2019-03-18: qty 15

## 2019-03-18 MED ORDER — IPRATROPIUM-ALBUTEROL 0.5-2.5 (3) MG/3ML IN SOLN: 3.0000 mL | RESPIRATORY_TRACT | 0 refills | Status: AC | PRN

## 2019-03-18 MED ORDER — LORAZEPAM 2 MG/ML IJ SOLN
1.0000 mg | INTRAMUSCULAR | Status: DC | PRN
  Administered 2019-03-18: 1 mg via INTRAVENOUS
  Filled 2019-03-18: qty 1

## 2019-03-18 MED ORDER — GLYCOPYRROLATE 0.2 MG/ML IJ SOLN: 0.2000 mg | INTRAMUSCULAR | Status: DC | PRN

## 2019-03-18 MED ORDER — BIOTENE DRY MOUTH MT LIQD: 15.0000 mL | OROMUCOSAL | Status: DC | PRN

## 2019-03-18 MED ORDER — LORAZEPAM 1 MG PO TABS: 1.0000 mg | ORAL_TABLET | ORAL | Status: DC | PRN

## 2019-03-18 MED ORDER — HYDROCORTISONE (PERIANAL) 2.5 % EX CREA: TOPICAL_CREAM | Freq: Four times a day (QID) | CUTANEOUS | 0 refills | Status: AC

## 2019-03-18 MED ORDER — HALOPERIDOL LACTATE 5 MG/ML IJ SOLN: 0.5000 mg | INTRAMUSCULAR | Status: DC | PRN

## 2019-03-18 MED ORDER — HALOPERIDOL LACTATE 2 MG/ML PO CONC
0.5000 mg | ORAL | Status: DC | PRN
  Filled 2019-03-18: qty 0.3

## 2019-03-18 MED ORDER — GLYCOPYRROLATE 1 MG PO TABS: 1.0000 mg | ORAL_TABLET | ORAL | 0 refills | Status: AC | PRN

## 2019-03-18 MED ORDER — POTASSIUM CHLORIDE 10 MEQ/100ML IV SOLN
INTRAVENOUS | Status: AC
  Administered 2019-03-18: 10 meq
  Filled 2019-03-18: qty 100

## 2019-03-18 MED ORDER — MORPHINE SULFATE (PF) 2 MG/ML IV SOLN
1.0000 mg | INTRAVENOUS | Status: DC | PRN
  Administered 2019-03-18 (×2): 1 mg via INTRAVENOUS
  Filled 2019-03-18 (×2): qty 1

## 2019-03-18 MED ORDER — POTASSIUM CHLORIDE 10 MEQ/100ML IV SOLN
10.0000 meq | INTRAVENOUS | Status: DC
  Administered 2019-03-18 (×4): 10 meq via INTRAVENOUS
  Filled 2019-03-18 (×3): qty 100

## 2019-03-18 MED ORDER — LORAZEPAM 2 MG/ML PO CONC: 1.0000 mg | ORAL | Status: DC | PRN

## 2019-03-18 MED ORDER — HYDROCORTISONE (PERIANAL) 2.5 % EX CREA
TOPICAL_CREAM | Freq: Four times a day (QID) | CUTANEOUS | Status: DC
  Administered 2019-03-18 (×2): via TOPICAL
  Filled 2019-03-18: qty 28.35

## 2019-03-18 NOTE — TOC Transition Note (Signed)
Transition of Care [facility]) - CM/SW Discharge Note   Patient Details  Name: Lynn Hardy MRN: [id number] Date of Birth: 15-Jul-1931  [facility] CM/SW Contact:  Wende Neighbors, LCSW Phone Number: 03/18/2019, 4:34 PM   Clinical Narrative:  Patient to discharge to [facility]. Patients daughter is going to facility to fill out paper work and [employer] requested that [facility] please set up transport for 6pm. RN to please contact [phone] for report.     Final next level of care: [place] Barriers to Discharge: No Barriers Identified   Patient Goals and CMS Choice     Choice offered to / list presented to : Adult Children  Discharge Placement              Patient chooses bed at: ([facility]) Patient to be transferred to facility by: [facility]tar Name of family member notified: daughter aware Patient and family notified of of transfer: 03/18/19  Discharge Plan and Services   Discharge Planning Services: NA                      HH Arranged: NA          Social Determinants of Health ([place]) Interventions     Readmission Risk Interventions Readmission Risk Prevention Plan 03/06/2019  Transportation Screening Complete  Some recent data might be hidden

## 2019-03-18 NOTE — Progress Notes (Signed)
CRITICAL VALUE ALERT  Critical Value:  K  2.6  Date & Time Notied: 03/18/2019 3:31 AM   Provider Notified: . Lamar Blinks NP  Orders Received/Actions taken: None at this time

## 2019-03-18 NOTE — Progress Notes (Signed)
Daily Progress Note   Patient Name: Lynn Hardy       Date: 03/18/2019 DOB: 12-Apr-1931  Age: 85 y.o. MRN#: [id number] Attending Physician: Elmarie Shiley, MD Primary Care Physician: Merrilee Seashore, MD Admit Date: 03/04/2019  Reason for Consultation/Follow-up: Establishing goals of care  Subjective: I saw and examined Lynn Hardy today.   Her condition has worsened overnight.  Daughter at bedside.  We discussed clinical course overnight and concern that she has aspirated again.  We discussed difference between a aggressive medical intervention path and a palliative, comfort focused care path.    Length of Stay: 13  Current Medications: Scheduled Meds:  . Chlorhexidine Gluconate Cloth  6 each Topical Daily  . hydrocortisone   Topical QID  . hydrocortisone  25 mg Rectal BID  . levalbuterol  0.63 mg Nebulization TID  . mouth rinse  15 mL Mouth Rinse BID  . mupirocin ointment  1 application Nasal BID  . nystatin cream   Topical BID    Continuous Infusions:   PRN Meds: acetaminophen **OR** acetaminophen, antiseptic oral rinse, bisacodyl, butamben-tetracaine-benzocaine, glycopyrrolate **OR** glycopyrrolate **OR** glycopyrrolate, haloperidol **OR** haloperidol **OR** haloperidol lactate, haloperidol lactate, ipratropium-albuterol, lip balm, LORazepam **OR** LORazepam **OR** LORazepam, morphine injection, ondansetron **OR** ondansetron (ZOFRAN) IV, polyvinyl alcohol  Physical Exam         General: Does not respond to verbal or tactile stimulation. Does not participate in conversation. Heart: Tachycardic. No murmur appreciated. Lungs: Tachypnic.  Fair air movement, Coarse. Increased WOB. Abdomen: Soft, nontender, nondistended, positive bowel sounds.  Ext: No  significant edema Skin: Warm and dry  Vital Signs: BP 123/84   Pulse (!) 125   Temp 98.9 F (37.2 C) (Axillary)   Resp (!) 23   Ht 5' 2.01" (1.575 m)   Wt 80.1 kg   SpO2 95%   BMI 32.29 kg/m  SpO2: SpO2: 95 % O2 Device: O2 Device: Nasal Cannula O2 Flow Rate: O2 Flow Rate (L/min): 3 L/min  Intake/output summary:   Intake/Output Summary (Last 24 hours) at 03/18/2019 1055 Last data filed at 03/18/2019 0522 Gross per 24 hour  Intake 2341.17 ml  Output 2325 ml  Net 16.17 ml   LBM: Last BM Date: 03/16/19 Baseline Weight: Weight: 80.1 kg Most recent weight: Weight: 80.1 kg       Palliative Assessment/Data:  Patient Active Problem List   Diagnosis Date Noted  . Aspiration into airway   . Palliative care by specialist   . Goals of care, counseling/discussion   . GI bleed 03/04/2019  . Rectal bleeding 03/04/2019  . AF (paroxysmal atrial fibrillation) ([place]) 03/04/2019  . Diverticulosis 03/04/2019  . Dementia ([place]) 10/16/2016  . Morbid obesity ([place]) 04/29/2016  . Ankle edema 05/08/2013  . HTN (hypertension) 05/08/2013  . NSVT (nonsustained ventricular tachycardia) ([place]) 05/08/2013  . Ectopic atrial tachycardia ([place]) 05/08/2013    Palliative Care Assessment & Plan   Patient Profile: 85 yo female with a fib, hiatal hernia, dementia who presented with blood per rectum.  Admitted with sepsis, aspiration, AKI on CKD.  Course complicated by confusion.  Started on dysphagia diet after MBS   Assessment: Patient Active Problem List   Diagnosis Date Noted  . Aspiration into airway   . Palliative care by specialist   . Goals of care, counseling/discussion   . GI bleed 03/04/2019  . Rectal bleeding 03/04/2019  . AF (paroxysmal atrial fibrillation) ([place]) 03/04/2019  . Diverticulosis 03/04/2019  . Dementia ([place]) 10/16/2016  . Morbid obesity ([place]) 04/29/2016  . Ankle edema 05/08/2013  . HTN (hypertension) 05/08/2013  . NSVT (nonsustained ventricular tachycardia) ([place])  05/08/2013  . Ectopic atrial tachycardia ([place]) 05/08/2013    Recommendations/Plan:  DNR/DNI  Patient acutely worse today.  I believe that she continues to aspirate and I discussed this at length with her daughter today.  Based upon her continued decline, her daughter believes that her mother would want to focus on comfort moving forward.  She would like to work to transition her mother to residential hospice facility (preference for [facility] as her sister cannot drive and will be able to get to [place]).  Consult placed to CSW.  Appreciate assistance in facilitating transition to residential hospice.  Comfort care moving forward.  Orders updated using EOL order set.   Code Status:    Code Status Orders  (From admission, onward)         Start     Ordered   03/14/19 0944  Do not attempt resuscitation (DNR)  Continuous    Question Answer Comment  In the event of cardiac or respiratory ARREST Do not call a "code blue"   In the event of cardiac or respiratory ARREST Do not perform Intubation, CPR, defibrillation or ACLS   In the event of cardiac or respiratory ARREST Use medication by any route, position, wound care, and other measures to relive pain and suffering. May use oxygen, suction and manual treatment of airway obstruction as needed for comfort.      03/14/19 0943        Code Status History    Date Active Date Inactive Code Status Order ID Comments User Context   03/04/2019 1745 03/14/2019 0943 Full Code [id number]  Arrien, Jimmy Picket, MD Inpatient   Advance Care Planning Activity       Prognosis:   < 2 weeks- She is acutely worse today with increased respiratory effort.  I believe that she continues to aspirate and her prognosis is less than 2 weeks.  With her continued increase in symptom burden (increased WOB and agitation) I believe that she will be best served by residential hospice if it can be arranged.  Discharge Planning:  Residential Hospice  Care plan  was discussed with daughter  Thank you for allowing the Palliative Medicine Team to assist in the care of this patient.  Time In: 1000 Time Out: 1050 Total Time 50 Prolonged Time Billed No      Greater than 50%  of this time was spent counseling and coordinating care related to the above assessment and plan.  Micheline Rough, MD  Please contact Palliative Medicine Team phone at [phone] for questions and concerns.

## 2019-03-18 NOTE — Progress Notes (Signed)
Report called to [employer] at [facility].

## 2019-03-18 NOTE — Discharge Summary (Signed)
Physician Discharge Summary  Lynn Hardy BBC:[id number] DOB: 1931/01/22 DOA: 03/04/2019  PCP: Merrilee Seashore, MD  Admit date: 03/04/2019 Discharge date: 03/18/2019  Admitted From: Home  Disposition:  Residential Hospice.   Recommendations for Outpatient Follow-up:  1. Comfort measure.     Discharge Condition: Stable./  CODE STATUS: DNR Diet recommendation: comfort feeding   Brief/Interim Summary: 85 year old female with history of dementia and paroxysmal atrial fibrillation on full dose aspirin,who presents with rectal bleeding. Denies any gastrointestinal symptoms, denies any orthostatic symptoms. History is limited due to cognitive impairment. Hemoccult test positive.CT of the abdomen with no acute findings, markeddistention of the urinary bladder. Large hiatal hernia. Left colonic diverticulosis without diverticulitis. Her chest x-ray had hyperinflation but no infiltrates. Since admission, no further bleeding, Hgb has been stable. Pt has developed progressive agitation, required physical and chemical restrains. She has been found to be very weak and deconditioned.  Hospital course complicated by acute hypoxic and hypercarbic respiratory failure secondary to aspiration pneumonia.  Patient was treated with IV antibiotics, supplemental oxygen, PRN BiPAP.  She was evaluated by speech therapy, she was a started on dysphagia diet.  Palliative care was consulted, patient was made DNR.  Overnight 12/24 patient appears to have aspirated again, respiration rate increasing to the 30s and 40s.  She was having increased work of breathing and discomfort.  She received low-dose morphine.  Discussed case with daughter who was okay with patient receiving IV morphine.  Subsequently meeting with palliative care, it  was decided to focus on comfort care, patient will be transferred to residential hospice facility.     Discharge Diagnoses:  Principal Problem:   GI bleed Active  Problems:   Dementia (HCC)   Rectal bleeding   AF (paroxysmal atrial fibrillation) (HCC)   Diverticulosis   Aspiration into airway   Palliative care by specialist   Goals of care, counseling/discussion  1-Rectal bleeding due to diverticulosis in the setting of anticoagulation with full dose aspirin Patient was treated with topical steroid for hemorrhoids. She did not require blood transfusion.  2-Sepsis secondary to aspiration pneumonia: Patient received IV fluids, IV antibiotics.  Repeat chest x-ray showed new right upper lobe and left lower lobe airspace disease suspicious for multifocal pneumonia. Patient continued to aspirate.  Discussion with family, goal is for comfort care  3-Acute hypoxic hypercapnic respiratory failure secondary to aspiration pneumonia; Patient was requiring oxygen supplementation.  She received IV Zosyn IV.  Patient continued to aspirate, now goal is comfort care.  4-acute on chronic renal failure stage III, urinary retention, oliguria Patient was treated with IV fluids. She will be discharged with Foley catheter.  5-hypernatremia: Secondary to poor oral intake.  No improvement despite being treated with D5.  6-CAUTI: UA with positive nitrates.  Patient receiving IV Zosyn  7-Paroxysmal A. Fib: Uncontrolled, on IV metoprolol  8-Dementia with new delirium: Patient developed progressive agitation and confusion. Supportive care.  Receive Haldol as needed  9-Perineal ulcer: Painful also in the perineal region with cutaneous fungal infection Wound care following.  10-Obesity, BMI 32    Discharge Instructions   Allergies as of 03/18/2019   No Known Allergies     Medication List    STOP taking these medications   aspirin EC 325 MG tablet   B-complex with vitamin C tablet   donepezil 10 MG tablet Commonly known as: ARICEPT   Fish Oil 1000 MG Caps   memantine 10 MG tablet Commonly known as: NAMENDA   multivitamin with minerals tablet  vitamin B-12 100 MCG tablet Commonly known as: CYANOCOBALAMIN   Vitamin D3 25 MCG (1000 UT) Caps   vitamin E 100 UNIT capsule     TAKE these medications   acetaminophen 500 MG tablet Commonly known as: TYLENOL Take 500 mg by mouth every 6 (six) hours as needed for mild pain.   glycopyrrolate 1 MG tablet Commonly known as: ROBINUL Take 1 tablet (1 mg total) by mouth every 4 (four) hours as needed (excessive secretions).   hydrocortisone 2.5 % rectal cream Commonly known as: ANUSOL-HC Apply topically 4 (four) times daily.   ipratropium-albuterol 0.5-2.5 (3) MG/3ML Soln Commonly known as: DUONEB Take 3 mLs by nebulization every 2 (two) hours as needed.      Contact information for after-discharge care    Destination    [facility] .   Service: Skilled Nursing Contact information: [address] [place] [phone]             No Known Allergies  Consultations: Palliative care.   Procedures/Studies: Ct Abdomen Pelvis Wo Contrast  Result Date: 03/04/2019 CLINICAL DATA:  Abdominal pain and diarrhea. Bloody stools. EXAM: CT ABDOMEN AND PELVIS WITHOUT CONTRAST TECHNIQUE: Multidetector CT imaging of the abdomen and pelvis was performed following the standard protocol without IV contrast. COMPARISON:  None. FINDINGS: Lower chest: Large hiatal hernia. Hepatobiliary: No focal abnormality in the liver on this study without intravenous contrast. There is no evidence for gallstones, gallbladder wall thickening, or pericholecystic fluid. No intrahepatic or extrahepatic biliary dilation. Pancreas: No focal mass lesion. No dilatation of the main duct. No intraparenchymal cyst. No peripancreatic edema. Spleen: No splenomegaly. No focal mass lesion. Adrenals/Urinary Tract: No adrenal nodule or mass. 2 mm nonobstructing stone identified upper pole right kidney. Left kidney unremarkable. No evidence for hydroureter. Bladder is  markedly distended. Stomach/Bowel: Large hiatal hernia with approximately 75% of the stomach in the thorax. Circumferential mild wall thickening in the distal stomach may be related to underdistention as there is no adjacent edema or inflammation. Duodenum is normally positioned as is the ligament of Treitz. No small bowel wall thickening. No small bowel dilatation. The terminal ileum is normal. The appendix is not visualized, but there is no edema or inflammation in the region of the cecum. No gross colonic mass. No colonic wall thickening. Diverticular changes are noted in the left colon without evidence of diverticulitis. Vascular/Lymphatic: There is abdominal aortic atherosclerosis without aneurysm. There is no gastrohepatic or hepatoduodenal ligament lymphadenopathy. No intraperitoneal or retroperitoneal lymphadenopathy. No pelvic sidewall lymphadenopathy. Reproductive: The uterus is surgically absent. There is no adnexal mass. Other: No intraperitoneal free fluid. Musculoskeletal: No worrisome lytic or sclerotic osseous abnormality. IMPRESSION: 1. No definite findings to explain the history of GI bleeding. 2. Marked distention of the urinary bladder. Dysfunction or bladder outlet obstruction should be considered. 3. Large hiatal hernia with approximately 75% of the stomach in the thorax. Mild circumferential wall thickening noted in the antral region without adjacent edema or inflammation. This may be related to underdistention, but infection/inflammation a consideration. 4. 2 mm nonobstructing right renal stone. 5. Left colonic diverticulosis without diverticulitis. 6.  Aortic Atherosclerois (ICD10-170.0) Electronically Signed   By: Misty Stanley M.D.   On: 03/04/2019 15:31   Dg Chest Port 1 View  Result Date: 03/17/2019 CLINICAL DATA:  Shortness of breath and fluid overload EXAM: PORTABLE CHEST 1 VIEW COMPARISON:  03/15/2019 FINDINGS: Cardiac shadow is stable. Aortic calcifications are again seen.  Persistent infiltrates are  noted in the right upper lobe and left base stable from the prior exam. No new focal abnormality is seen. No sizable effusion is noted. IMPRESSION: Multifocal pneumonia stable from the previous exam. Electronically Signed   By: Inez Catalina M.D.   On: 03/17/2019 09:56   Dg Chest Port 1 View  Result Date: 03/15/2019 CLINICAL DATA:  Productive cough. Shortness of breath. Pneumonia. EXAM: PORTABLE CHEST 1 VIEW COMPARISON:  03/13/2019 FINDINGS: Heart size is stable.  Aortic atherosclerosis. New airspace disease is seen in the right upper lobe and left lower lobe, suspicious for multifocal pneumonia. No evidence of pleural effusion. IMPRESSION: New right upper lobe and left lower lobe airspace disease, suspicious for multifocal pneumonia. Electronically Signed   By: Marlaine Hind M.D.   On: 03/15/2019 15:50   Dg Chest Port 1 View  Result Date: 03/13/2019 CLINICAL DATA:  Aspiration EXAM: PORTABLE CHEST 1 VIEW COMPARISON:  March 12, 2019 FINDINGS: Lungs remain hyperexpanded. There is no pneumothorax. There is pleuroparenchymal scarring at the lung apices. No large pleural effusion. No infiltrate. No acute osseous abnormality. Heart size is stable. Aortic calcifications are noted. IMPRESSION: Stable appearance of the chest.  No new focal infiltrate. Electronically Signed   By: Constance Holster M.D.   On: 03/13/2019 23:26   Dg Chest Port 1 View  Result Date: 03/12/2019 CLINICAL DATA:  Leukocytosis EXAM: PORTABLE CHEST 1 VIEW COMPARISON:  03/04/2019 FINDINGS: Stable hyperinflation with apical scarring. No focal pneumonia, collapse or consolidation. Negative for edema, effusion or pneumothorax. Trachea midline. Aorta atherosclerotic. Hiatal hernia again noted. Bones are osteopenic. Degenerative changes of the spine. Azygos fissure in the right upper lobe, normal variant. IMPRESSION: Stable chest exam.  No interval change or acute process. Electronically Signed   By: Jerilynn Mages.  Shick M.D.   On:  03/12/2019 09:22   Dg Chest Port 1 View  Result Date: 03/04/2019 CLINICAL DATA:  Altered mental status EXAM: PORTABLE CHEST 1 VIEW COMPARISON:  None. FINDINGS: The heart size and mediastinal contours are within normal limits. Both lungs are clear. The visualized skeletal structures are unremarkable. IMPRESSION: No active disease. Electronically Signed   By: Dorise Bullion III M.D   On: 03/04/2019 14:18   Dg Abd Portable 2v  Result Date: 03/12/2019 CLINICAL DATA:  85 year old female with a history of constipation EXAM: PORTABLE ABDOMEN - 2 VIEW COMPARISON:  CT 03/04/2011 FINDINGS: Paucity of stomach gas. Minimal small bowel gas without abnormal distention. Minimal colonic gas without abnormal distention. Formed stool within rectum, sigmoid colon, descending colon, and the distal transverse colon. No unexpected radiopaque foreign body. No unexpected soft tissue density. No displaced fracture. Degenerative changes of the spine IMPRESSION: Nonobstructive bowel gas pattern. Moderate to large formed stool burden of the left colon, potentially representing constipation. Electronically Signed   By: Corrie Mckusick D.O.   On: 03/12/2019 10:46   Dg Swallowing Func-speech Pathology  Result Date: 03/16/2019 Objective Swallowing Evaluation: Type of Study: MBS-Modified Barium Swallow Study  Patient Details Name: Shalanda Brogden MRN: [id number] Date of Birth: [birth year] Today's Date: 03/16/2019 Time: SLP Start Time (ACUTE ONLY): 94 -SLP Stop Time (ACUTE ONLY): 1530 SLP Time Calculation (min) (ACUTE ONLY): 15 min Past Medical History: Past Medical History: Diagnosis Date . Asthma  . Atrial fibrillation ([place])  . Cataract  . NSVT (nonsustained ventricular tachycardia) ([place])  . PAT (paroxysmal atrial tachycardia) ([place])  . Stroke [facility])  Past Surgical History: Past Surgical History: Procedure Laterality Date . ABDOMINAL HYSTERECTOMY   HPI: Mykayla Brinton is  a 85 y.o. female with medical history significant  of dementia, atrial fibrillation, CVA and dementia who presents from home on 6/17 due to rectal bleeding. Dx with GI bleed due to diverticulosis. On 6/26, patient no documented difficulty swallowing but with an episode of coughing and choking following pill intake (crushed in applesauce) followed by difficulty breathing. Transferred to ICU. Patient then with vomiting and a probably aspiration event per RN notes. Rapid respopnse called, NT suctioned large amounts of gastric contents and partial pill fragments.  Subjective: cooperative Assessment / Plan / Recommendation CHL IP CLINICAL IMPRESSIONS 03/16/2019 Clinical Impression Patient presents with a mild-moderate sensori-motor dysphagia with oral transit delays, lingual pumping and piecemeal swallowing of purees and regular solids, swallow initiation delay to vallecular sinus with puree and regular solids, swallow initiation delay to pyrirform sinus with thin and nectar thick liquids, aspiration during the swallow with thin liquids that patient sensed after it had passed through vocal cords into trachea and ineffective in clearing aspirate. She had trace to mild vallecular residuals with nectar and thin liquids and mild vallecular residuals with puree and regular solids, which cleared with subsequent swallows. No penetration or aspiration with nectar thick liquids with cup sips or successive straw sips.  SLP Visit Diagnosis Dysphagia, oropharyngeal phase (R13.12) Attention and concentration deficit following -- Frontal lobe and executive function deficit following -- Impact on safety and function Moderate aspiration risk;Mild aspiration risk   CHL IP TREATMENT RECOMMENDATION 03/16/2019 Treatment Recommendations Therapy as outlined in treatment plan below   Prognosis 03/16/2019 Prognosis for Safe Diet Advancement Good Barriers to Reach Goals Cognitive deficits Barriers/Prognosis Comment -- CHL IP DIET RECOMMENDATION 03/16/2019 SLP Diet Recommendations Dysphagia 1 (Puree)  solids;Nectar thick liquid Liquid Administration via Straw;Cup Medication Administration Crushed with puree Compensations Minimize environmental distractions;Slow rate;Small sips/bites Postural Changes Seated upright at 90 degrees   CHL IP OTHER RECOMMENDATIONS 03/16/2019 Recommended Consults -- Oral Care Recommendations Oral care BID Other Recommendations Order thickener from pharmacy;Prohibited food (jello, ice cream, thin soups);Remove water pitcher;Clarify dietary restrictions   CHL IP FOLLOW UP RECOMMENDATIONS 03/16/2019 Follow up Recommendations Skilled Nursing facility;24 hour supervision/assistance   CHL IP FREQUENCY AND DURATION 03/16/2019 Speech Therapy Frequency (ACUTE ONLY) min 2x/week Treatment Duration 2 weeks      CHL IP ORAL PHASE 03/16/2019 Oral Phase Impaired Oral - Pudding Teaspoon -- Oral - Pudding Cup -- Oral - Honey Teaspoon -- Oral - Honey Cup -- Oral - Nectar Teaspoon -- Oral - Nectar Cup Lingual pumping;Weak lingual manipulation;Piecemeal swallowing;Delayed oral transit Oral - Nectar Straw -- Oral - Thin Teaspoon -- Oral - Thin Cup Lingual pumping;Delayed oral transit;Premature spillage Oral - Thin Straw Premature spillage;Lingual pumping Oral - Puree Lingual pumping;Weak lingual manipulation;Reduced posterior propulsion;Delayed oral transit;Piecemeal swallowing Oral - Mech Soft -- Oral - Regular Impaired mastication;Weak lingual manipulation;Lingual pumping;Delayed oral transit;Piecemeal swallowing Oral - Multi-Consistency -- Oral - Pill -- Oral Phase - Comment --  CHL IP PHARYNGEAL PHASE 03/16/2019 Pharyngeal Phase Impaired Pharyngeal- Pudding Teaspoon -- Pharyngeal -- Pharyngeal- Pudding Cup -- Pharyngeal -- Pharyngeal- Honey Teaspoon -- Pharyngeal -- Pharyngeal- Honey Cup -- Pharyngeal -- Pharyngeal- Nectar Teaspoon -- Pharyngeal -- Pharyngeal- Nectar Cup Delayed swallow initiation-pyriform sinuses;Pharyngeal residue - valleculae;Pharyngeal residue - posterior pharnyx Pharyngeal --  Pharyngeal- Nectar Straw Delayed swallow initiation-pyriform sinuses;Pharyngeal residue - valleculae;Pharyngeal residue - posterior pharnyx Pharyngeal -- Pharyngeal- Thin Teaspoon NT Pharyngeal -- Pharyngeal- Thin Cup Delayed swallow initiation-pyriform sinuses;Penetration/Aspiration during swallow;Pharyngeal residue - pyriform;Pharyngeal residue - valleculae;Pharyngeal residue - posterior pharnyx Pharyngeal Material enters airway, passes  BELOW cords and not ejected out despite cough attempt by patient Pharyngeal- Thin Straw NT Pharyngeal -- Pharyngeal- Puree Delayed swallow initiation-vallecula;Reduced pharyngeal peristalsis;Pharyngeal residue - valleculae Pharyngeal -- Pharyngeal- Mechanical Soft -- Pharyngeal -- Pharyngeal- Regular Delayed swallow initiation-vallecula;Pharyngeal residue - valleculae;Reduced pharyngeal peristalsis Pharyngeal -- Pharyngeal- Multi-consistency -- Pharyngeal -- Pharyngeal- Pill -- Pharyngeal -- Pharyngeal Comment --  CHL IP CERVICAL ESOPHAGEAL PHASE 03/16/2019 Cervical Esophageal Phase WFL Pudding Teaspoon -- Pudding Cup -- Honey Teaspoon -- Honey Cup -- Nectar Teaspoon -- Nectar Cup -- Nectar Straw -- Thin Teaspoon -- Thin Cup -- Thin Straw -- Puree -- Mechanical Soft -- Regular -- Multi-consistency -- Pill -- Cervical Esophageal Comment -- Dannial Monarch 03/16/2019, 4:44 PM Sonia Baller, MA, CCC-SLP Speech Therapy [facility]                Subjective: Patient lethargic, tachypnea, no responsive.  Overnight RR increase, received morphine.   Discharge Exam: Vitals:   03/18/19 0800 03/18/19 0803  BP:    Pulse:    Resp:    Temp: 98.9 F (37.2 C)   SpO2:  95%     General: lethargic, mild distress Cardiovascular: RRR, S1/S2 +, no rubs, no gallops Respiratory: tachypnea, bilateral ronchus Abdominal: Soft, NT, ND, bowel sounds + Extremities: no edema, no cyanosis    The results of significant diagnostics from this hospitalization (including imaging,  microbiology, ancillary and laboratory) are listed below for reference.     Microbiology: Recent Results (from the past 240 hour(s))  Culture, Urine     Status: Abnormal   Collection Time: 03/12/19 10:30 AM   Specimen: Urine, Catheterized  Result Value Ref Range Status   Specimen Description   Final    URINE, CATHETERIZED Performed at [facility], [place] [address], [place], [place] [address]    Special Requests   Final    NONE Performed at [facility], [place] [address] [place], [place] [address]    Culture >=100,000 COLONIES/mL ESCHERICHIA COLI (A)  Final   Report Status 03/14/2019 FINAL  Final   Organism ID, Bacteria ESCHERICHIA COLI (A)  Final      Susceptibility   Escherichia coli - MIC*    AMPICILLIN >=32 RESISTANT Resistant     CEFAZOLIN <=4 SENSITIVE Sensitive     CEFTRIAXONE <=1 SENSITIVE Sensitive     CIPROFLOXACIN <=0.25 SENSITIVE Sensitive     GENTAMICIN <=1 SENSITIVE Sensitive     IMIPENEM <=0.25 SENSITIVE Sensitive     NITROFURANTOIN <=16 SENSITIVE Sensitive     TRIMETH/SULFA <=20 SENSITIVE Sensitive     AMPICILLIN/SULBACTAM >=32 RESISTANT Resistant     PIP/TAZO <=4 SENSITIVE Sensitive     Extended ESBL NEGATIVE Sensitive     * >=100,000 COLONIES/mL ESCHERICHIA COLI  Culture, blood (routine x 2)     Status: None (Preliminary result)   Collection Time: 03/14/19  4:07 PM   Specimen: BLOOD LEFT ARM  Result Value Ref Range Status   Specimen Description   Final    BLOOD LEFT ARM Performed at [facility], [address] [place], [place] [address]    Special Requests   Final    BOTTLES DRAWN AEROBIC ONLY Blood Culture adequate volume Performed at [place] [address], [place], [place] [address]    Culture   Final    NO GROWTH 4 DAYS Performed at [facility], [place] [address], [place], [place] [address]    Report Status PENDING  Incomplete  Culture, blood (routine x 2)     Status: None  (Preliminary result)   Collection Time: 03/14/19  4:07 PM   Specimen: BLOOD  Result Value Ref Range Status   Specimen Description   Final    BLOOD LEFT ANTECUBITAL Performed at [place] [address], [place], [place] [address]    Special Requests   Final    BOTTLES DRAWN AEROBIC ONLY Blood Culture adequate volume Performed at [place] [address], [place], [place] [address]    Culture   Final    NO GROWTH 4 DAYS Performed at [facility], [place] [address], [place], [place] [address]    Report Status PENDING  Incomplete  MRSA PCR Screening     Status: Abnormal   Collection Time: 03/16/19  1:32 PM   Specimen: Nasal Mucosa; Nasopharyngeal  Result Value Ref Range Status   MRSA by PCR POSITIVE (A) NEGATIVE Final    Comment:        The GeneXpert MRSA Assay (FDA approved for NASAL specimens only), is one component of a comprehensive MRSA colonization surveillance program. It is not intended to diagnose MRSA infection nor to guide or monitor treatment for MRSA infections. RESULT CALLED TO, READ BACK BY AND VERIFIED WITH: REBECCA PULEO,RN [id number] @ [place] Performed at [place] [address], [place], [place] [address]      Labs: BNP (last 3 results) No results for input(s): BNP in the last 8760 hours. Basic Metabolic Panel: Recent Labs  Lab 03/16/19 0216 03/17/19 0251 03/17/19 0649 03/17/19 1840 03/18/19 0250  NA 147* 151* 151* 149* 151*  K 4.3 4.0 3.8 3.0* 2.6*  CL 115* 120* 120* 113* 114*  CO2 21* 21* 19* 25 26  GLUCOSE 141* 166* 199* 270* 148*  BUN 21 23 23  28* 25*  CREATININE 1.05* 1.00 1.01* 1.22* 1.13*  CALCIUM 8.4* 9.0 8.8* 8.7* 8.6*   Liver Function Tests: No results for input(s): AST, ALT, ALKPHOS, BILITOT, PROT, ALBUMIN in the last 168 hours. No results for input(s): LIPASE, AMYLASE in the last 168 hours. No results for input(s): AMMONIA in the last 168  hours. CBC: Recent Labs  Lab 03/14/19 1945 03/15/19 0222 03/16/19 0657 03/17/19 0251 03/18/19 0250  WBC 13.2* 14.0* 10.5 12.5* 8.7  NEUTROABS 11.4* 11.1* 9.5* 10.3* 6.8  HGB 13.8 13.5 12.9 12.8 11.8*  HCT 43.1 43.0 42.0 40.4 37.0  MCV 94.1 95.3 99.1 96.9 95.4  PLT 151 170 149* 160 130*   Cardiac Enzymes: No results for input(s): CKTOTAL, CKMB, CKMBINDEX, TROPONINI in the last 168 hours. BNP: Invalid input(s): POCBNP CBG: Recent Labs  Lab 03/17/19 1551 03/17/19 1948 03/17/19 2326 03/18/19 0329 03/18/19 0751  GLUCAP 252* 198* 165* 145* 182*   D-Dimer No results for input(s): DDIMER in the last 72 hours. Hgb A1c No results for input(s): HGBA1C in the last 72 hours. Lipid Profile No results for input(s): CHOL, HDL, LDLCALC, TRIG, CHOLHDL, LDLDIRECT in the last 72 hours. Thyroid function studies No results for input(s): TSH, T4TOTAL, T3FREE, THYROIDAB in the last 72 hours.  Invalid input(s): FREET3 Anemia work up No results for input(s): VITAMINB12, FOLATE, FERRITIN, TIBC, IRON, RETICCTPCT in the last 72 hours. Urinalysis    Component Value Date/Time   COLORURINE AMBER (A) 03/12/2019 0931   APPEARANCEUR CLOUDY (A) 03/12/2019 0931   LABSPEC 1.019 03/12/2019 [place] 6.0 03/12/2019 [place] 03/12/2019 0931  HGBUR LARGE (A) 03/12/2019 0931   BILIRUBINUR NEGATIVE 03/12/2019 0931   KETONESUR 5 (A) 03/12/2019 0931   PROTEINUR >=300 (A) 03/12/2019 0931   NITRITE POSITIVE (A) 03/12/2019 0931   LEUKOCYTESUR LARGE (A) 03/12/2019 0931   Sepsis Labs Invalid input(s): PROCALCITONIN,  WBC,  LACTICIDVEN Microbiology Recent Results (from the past 240 hour(s))  Culture, Urine     Status: Abnormal   Collection Time: 03/12/19 10:30 AM   Specimen: Urine, Catheterized  Result Value Ref Range Status   Specimen Description   Final    URINE, CATHETERIZED Performed at [facility], [place] [address] [place], [place] [address]    Special  Requests   Final    NONE Performed at [facility], [place] [address] [place], [place] [address]    Culture >=100,000 COLONIES/mL ESCHERICHIA COLI (A)  Final   Report Status 03/14/2019 FINAL  Final   Organism ID, Bacteria ESCHERICHIA COLI (A)  Final      Susceptibility   Escherichia coli - MIC*    AMPICILLIN >=32 RESISTANT Resistant     CEFAZOLIN <=4 SENSITIVE Sensitive     CEFTRIAXONE <=1 SENSITIVE Sensitive     CIPROFLOXACIN <=0.25 SENSITIVE Sensitive     GENTAMICIN <=1 SENSITIVE Sensitive     IMIPENEM <=0.25 SENSITIVE Sensitive     NITROFURANTOIN <=16 SENSITIVE Sensitive     TRIMETH/SULFA <=20 SENSITIVE Sensitive     AMPICILLIN/SULBACTAM >=32 RESISTANT Resistant     PIP/TAZO <=4 SENSITIVE Sensitive     Extended ESBL NEGATIVE Sensitive     * >=100,000 COLONIES/mL ESCHERICHIA COLI  Culture, blood (routine x 2)     Status: None (Preliminary result)   Collection Time: 03/14/19  4:07 PM   Specimen: BLOOD LEFT ARM  Result Value Ref Range Status   Specimen Description   Final    BLOOD LEFT ARM Performed at [facility], [address] [place], [place] [address]    Special Requests   Final    BOTTLES DRAWN AEROBIC ONLY Blood Culture adequate volume Performed at [facility] [address], [place], [place] [address]    Culture   Final    NO GROWTH 4 DAYS Performed at [facility], [place] [address] [place], [place] [address]    Report Status PENDING  Incomplete  Culture, blood (routine x 2)     Status: None (Preliminary result)   Collection Time: 03/14/19  4:07 PM   Specimen: BLOOD  Result Value Ref Range Status   Specimen Description   Final    BLOOD LEFT ANTECUBITAL Performed at [facility] [address], [place], [place] [address]    Special Requests   Final    BOTTLES DRAWN AEROBIC ONLY Blood Culture adequate volume Performed at [facility] [address] [place],   [address]    Culture   Final    NO GROWTH 4 DAYS Performed at [facility], [place] [address], [place],  [address]    Report Status PENDING  Incomplete  MRSA PCR Screening     Status: Abnormal   Collection Time: 03/16/19  1:32 PM   Specimen: Nasal Mucosa; Nasopharyngeal  Result Value Ref Range Status   MRSA by PCR POSITIVE (A) NEGATIVE Final    Comment:        The GeneXpert MRSA Assay (FDA approved for NASAL specimens only), is one component of a comprehensive MRSA colonization surveillance program. It is not intended  to diagnose MRSA infection nor to guide or monitor treatment for MRSA infections. RESULT CALLED TO, READ BACK BY AND VERIFIED WITH: REBECCA PULEO,RN [id number] @ [facility] Performed at [facility] [address], [place], [place] [address]      Time coordinating discharge: 40 minutes  SIGNED:   Elmarie Shiley, MD  [facility]

## 2019-03-18 NOTE — Progress Notes (Signed)
  Made Np Schorr aware that the pt's RR  was sustaining in the high 40's 46 was  last noted. The Pt  appears to be asleep on 4 liters of high flow 02,  Morphine was given for increase moaning and discomfort, v/s 138/84, hr 91, 02 96 %. No orders given at this time. I will continue to monitor.

## 2019-03-18 NOTE — TOC Progression Note (Signed)
Transition of Care [facility]) - Progression Note    Patient Details  Name: Lynn Hardy MRN: [id number] Date of Birth: 1930/12/05  Transition of Care [facility]) CM/SW [place], LCSW Phone Number: 03/18/2019, 11:10 AM  Clinical Narrative:  CSW received consult to assist patient in transitioning patient into a residential hospice facility. Per palliative team patients daughter would prefer [facility] care). CSW made a referral to [facility] from [facility] and she stated she will reach out to CSW once decision has been made      Expected Discharge Plan: [place] Barriers to Discharge: Continued Medical Work up  Expected Discharge Plan and Services Expected Discharge Plan: [place]   Discharge Planning Services: NA   Living arrangements for the past 2 months: Single Family Home Expected Discharge Date: (unknown)                         HH Arranged: NA           Social Determinants of Health (SDOH) Interventions    Readmission Risk Interventions Readmission Risk Prevention Plan 03/06/2019  Transportation Screening Complete  Some recent data might be hidden

## 2019-03-18 NOTE — Progress Notes (Signed)
Made on call NP aware of the pt's moderate amount of burgundy blood, noted on the pt's incontinence pad. It appear to be coming from, the pt's external hemorrhoids MD ordered cream applied. I will continue to monitor.

## 2019-03-19 LAB — CULTURE, BLOOD (ROUTINE X 2)
Culture: NO GROWTH
Culture: NO GROWTH
Special Requests: ADEQUATE
Special Requests: ADEQUATE

## 2019-04-16 ENCOUNTER — Other Ambulatory Visit: Payer: Self-pay

## 2019-04-18 DEATH — deceased

## 2020-05-06 IMAGING — CT CT ABDOMEN AND PELVIS WITHOUT CONTRAST
2 of 4 series · 16 of 46 positions shown, 18 images · non-contrast
Comparison: None.

CLINICAL DATA: Abdominal pain and diarrhea. Bloody stools.

EXAM:
CT ABDOMEN AND PELVIS WITHOUT CONTRAST
TECHNIQUE: Multidetector CT imaging of the abdomen and pelvis was performed
following the standard protocol without IV contrast.

[Series 2: axial st · axial · 0.82mm/px · z∈[-749,-389]mm · 13 of 82 slices shown, 15 images]
[im 5/82  soft-tissue]
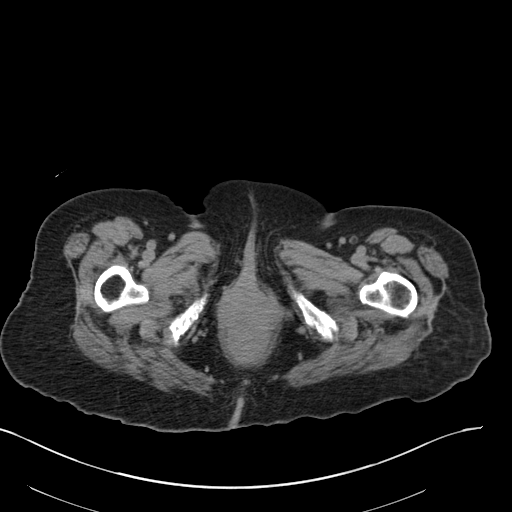
[im 5/82  bone]
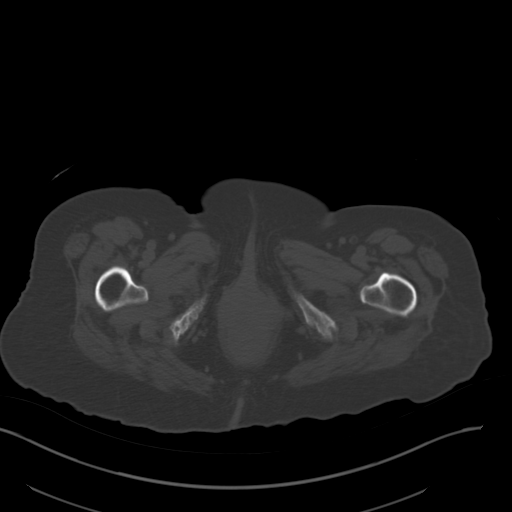
[im 13/82  soft-tissue]
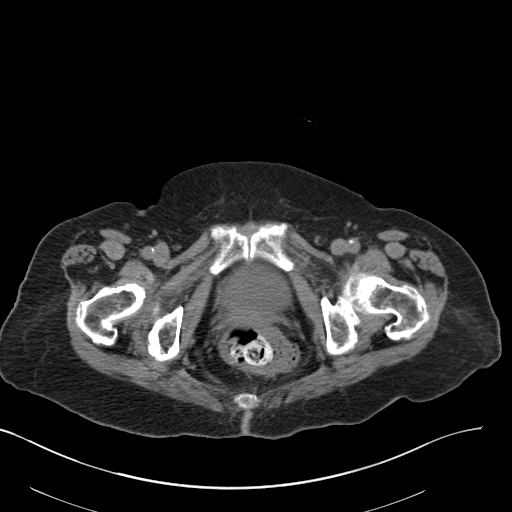
[im 18/82  soft-tissue]
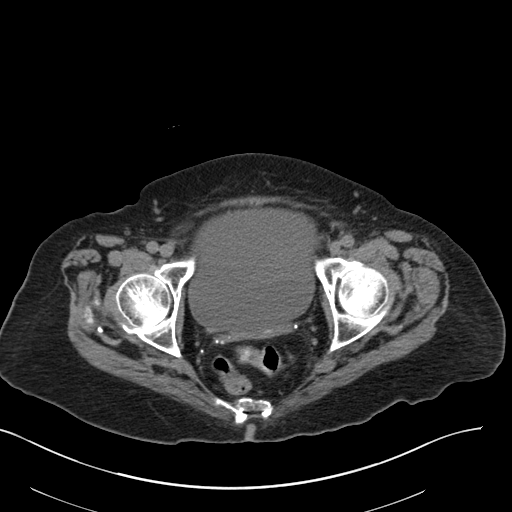
[im 22/82  soft-tissue]
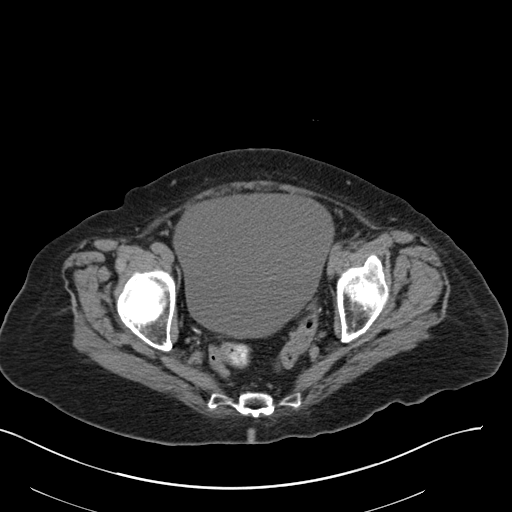
[im 30/82  soft-tissue]
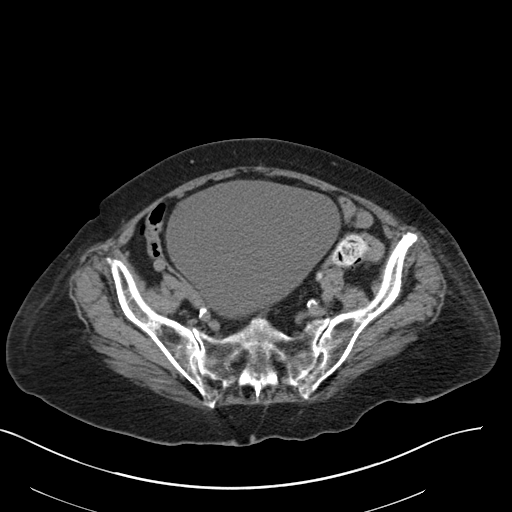
[im 35/82  soft-tissue]
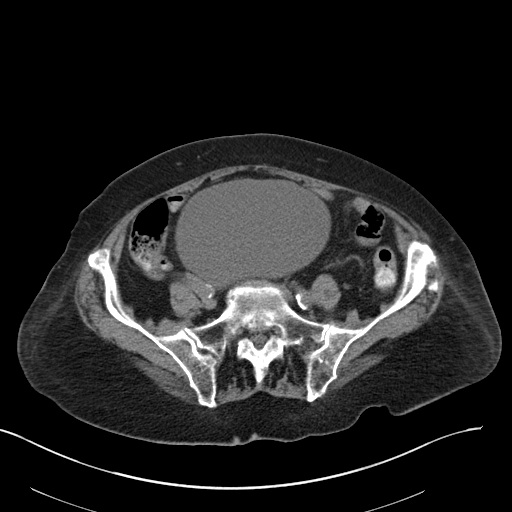
[im 43/82  soft-tissue]
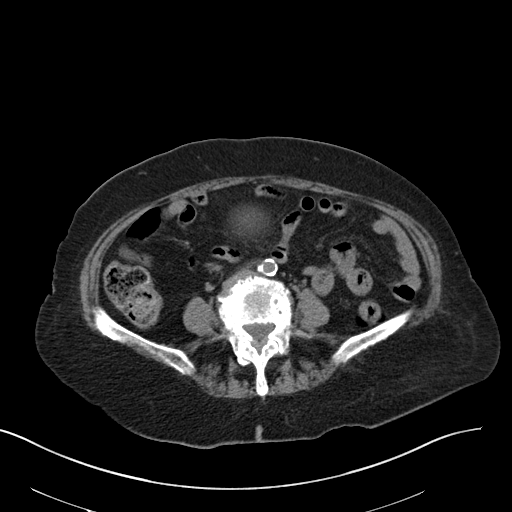
[im 47/82  soft-tissue]
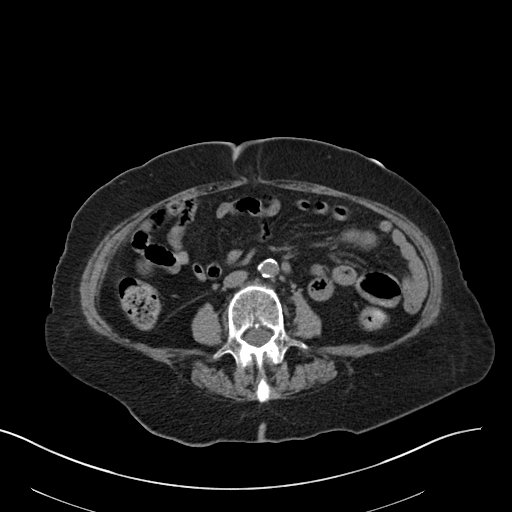
[im 52/82  soft-tissue]
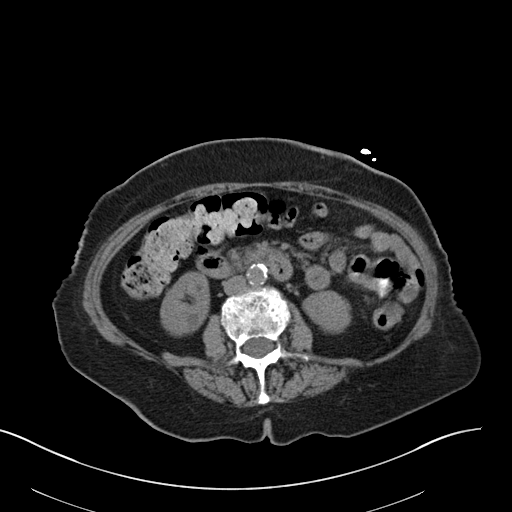
[im 52/82  bone]
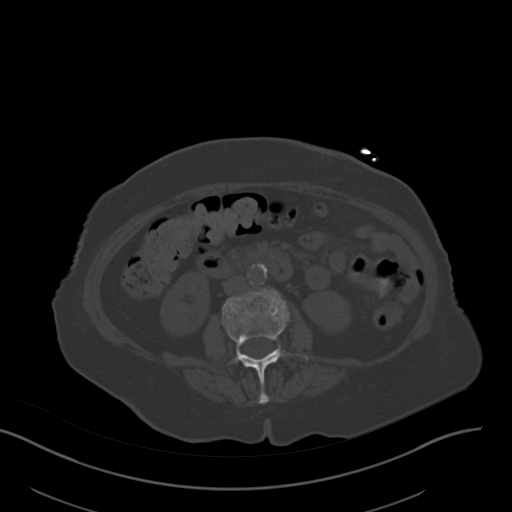
[im 60/82  soft-tissue]
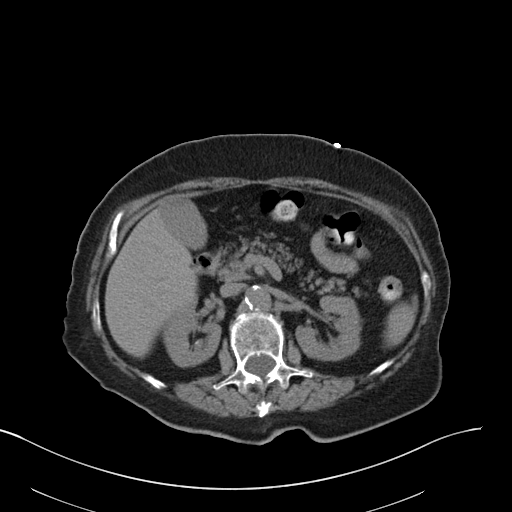
[im 64/82  soft-tissue]
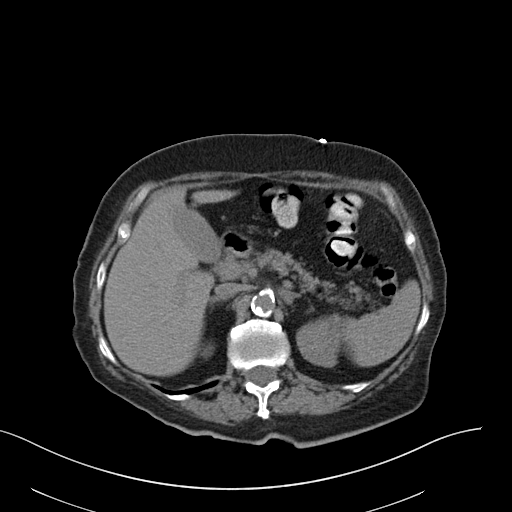
[im 69/82  soft-tissue]
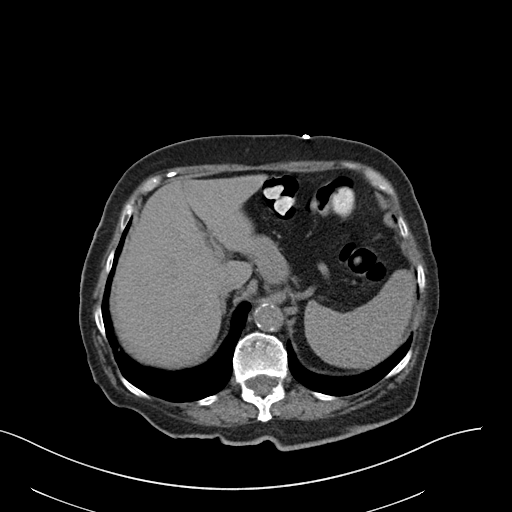
[im 77/82  soft-tissue]
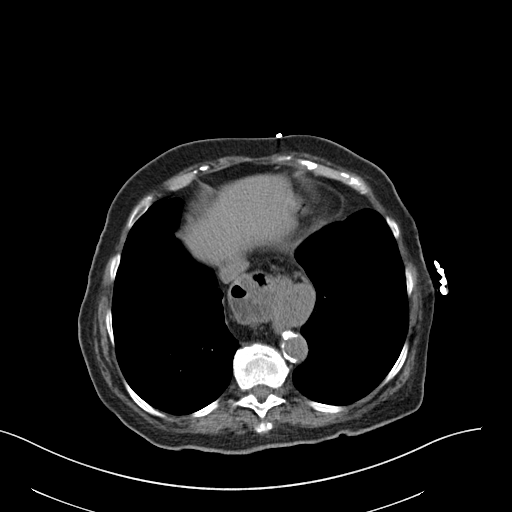

[Series 5: coronal st · coronal · 0.71mm/px · 3 of 125 slices shown]
[im 42/125  soft-tissue]
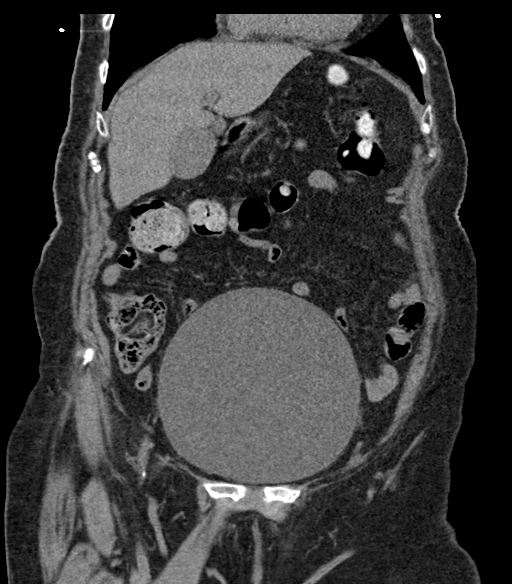
[im 56/125  soft-tissue]
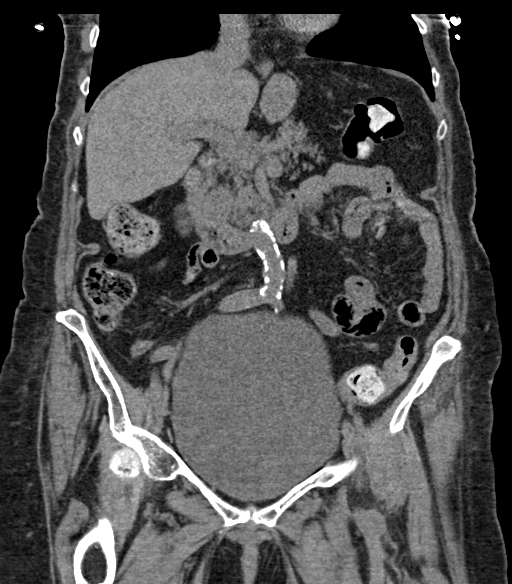
[im 69/125  soft-tissue]
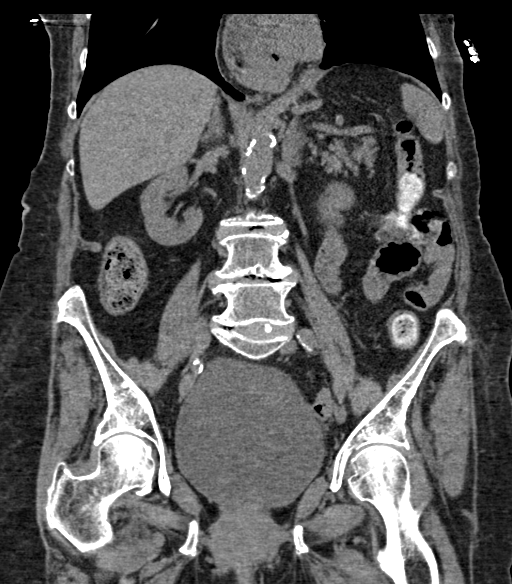

[16 of 46 positions shown; findings below may reference images not displayed]

FINDINGS: Lower chest: Large hiatal hernia.

Hepatobiliary: No focal abnormality in the liver on this study
without intravenous contrast. There is no evidence for gallstones,
gallbladder wall thickening, or pericholecystic fluid. No
intrahepatic or extrahepatic biliary dilation.

Pancreas: No focal mass lesion. No dilatation of the main duct. No
intraparenchymal cyst. No peripancreatic edema.

Spleen: No splenomegaly. No focal mass lesion.

Adrenals/Urinary Tract: No adrenal nodule or mass. 2 mm
nonobstructing stone identified upper pole right kidney. Left kidney
unremarkable. No evidence for hydroureter. Bladder is markedly
distended.

Stomach/Bowel: Large hiatal hernia with approximately 75% of the
stomach in the thorax. Circumferential mild wall thickening in the
distal stomach may be related to underdistention as there is no
adjacent edema or inflammation. Duodenum is normally positioned as
is the ligament of Treitz. No small bowel wall thickening. No small
bowel dilatation. The terminal ileum is normal. The appendix is not
visualized, but there is no edema or inflammation in the region of
the cecum. No gross colonic mass. No colonic wall thickening.
Diverticular changes are noted in the left colon without evidence of
diverticulitis.

Vascular/Lymphatic: There is abdominal aortic atherosclerosis
without aneurysm. There is no gastrohepatic or hepatoduodenal
ligament lymphadenopathy. No intraperitoneal or retroperitoneal
lymphadenopathy. No pelvic sidewall lymphadenopathy.

Reproductive: The uterus is surgically absent. There is no adnexal
mass.

Other: No intraperitoneal free fluid.

Musculoskeletal: No worrisome lytic or sclerotic osseous
abnormality.
IMPRESSION: 1. No definite findings to explain the history of GI bleeding.
2. Marked distention of the urinary bladder. Dysfunction or bladder
outlet obstruction should be considered.
3. Large hiatal hernia with approximately 75% of the stomach in the
thorax. Mild circumferential wall thickening noted in the antral
region without adjacent edema or inflammation. This may be related
to underdistention, but infection/inflammation a consideration.
4. 2 mm nonobstructing right renal stone.
5. Left colonic diverticulosis without diverticulitis.
6.  Aortic Atherosclerois ([id number])

## 2020-05-14 IMAGING — DX PORTABLE ABDOMEN - 2 VIEW
3 series · 3 of 3 positions shown · non-contrast
Comparison: CT 03/04/2011

CLINICAL DATA: 87-year-old female with a history of constipation

EXAM:
PORTABLE ABDOMEN - 2 VIEW

[abdomen kub (1 of 2)]
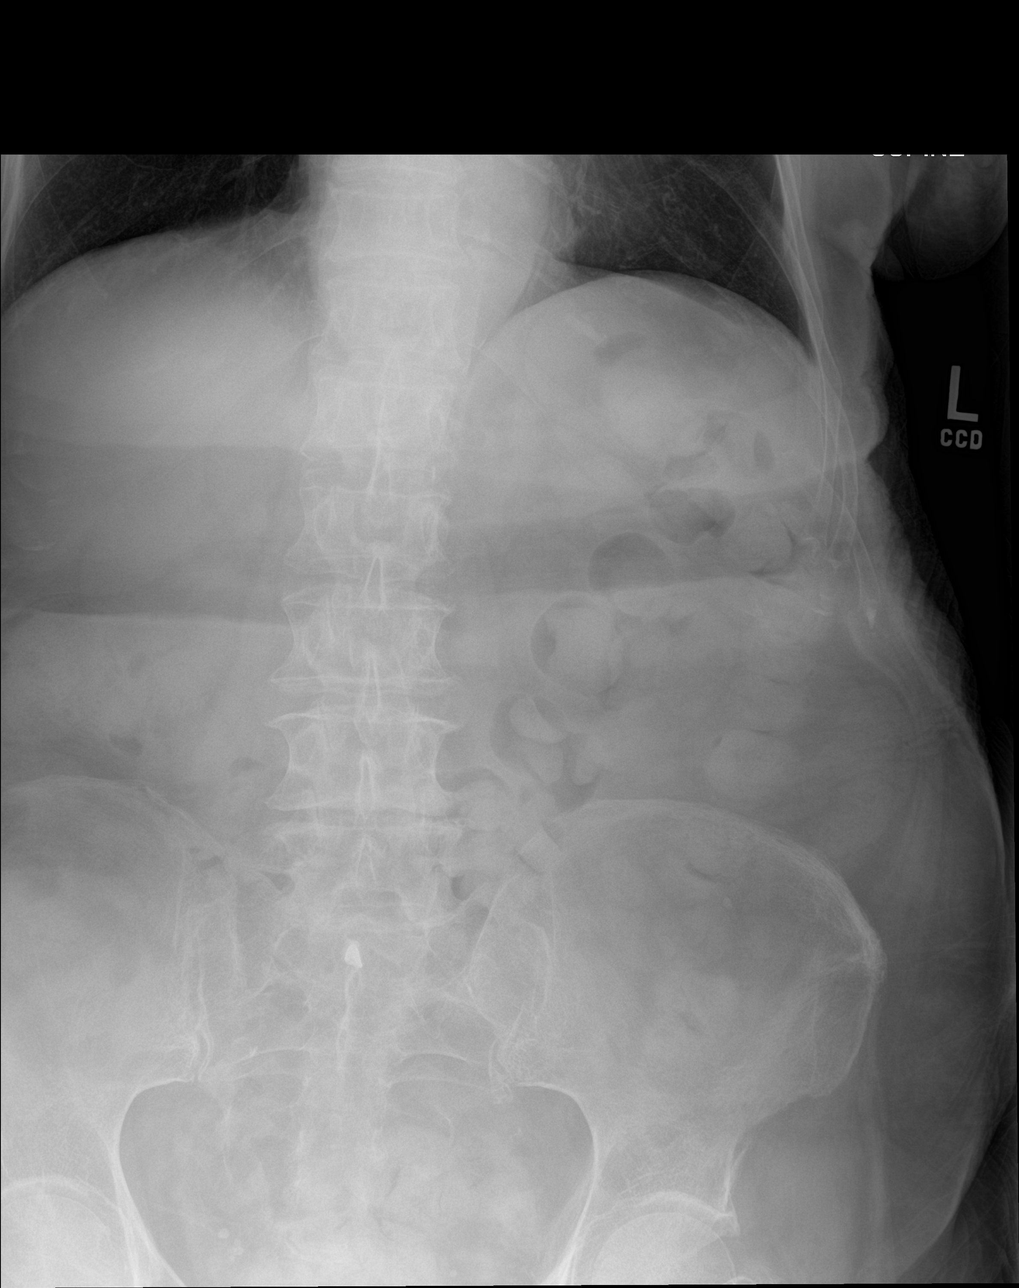

[abdomen erect]
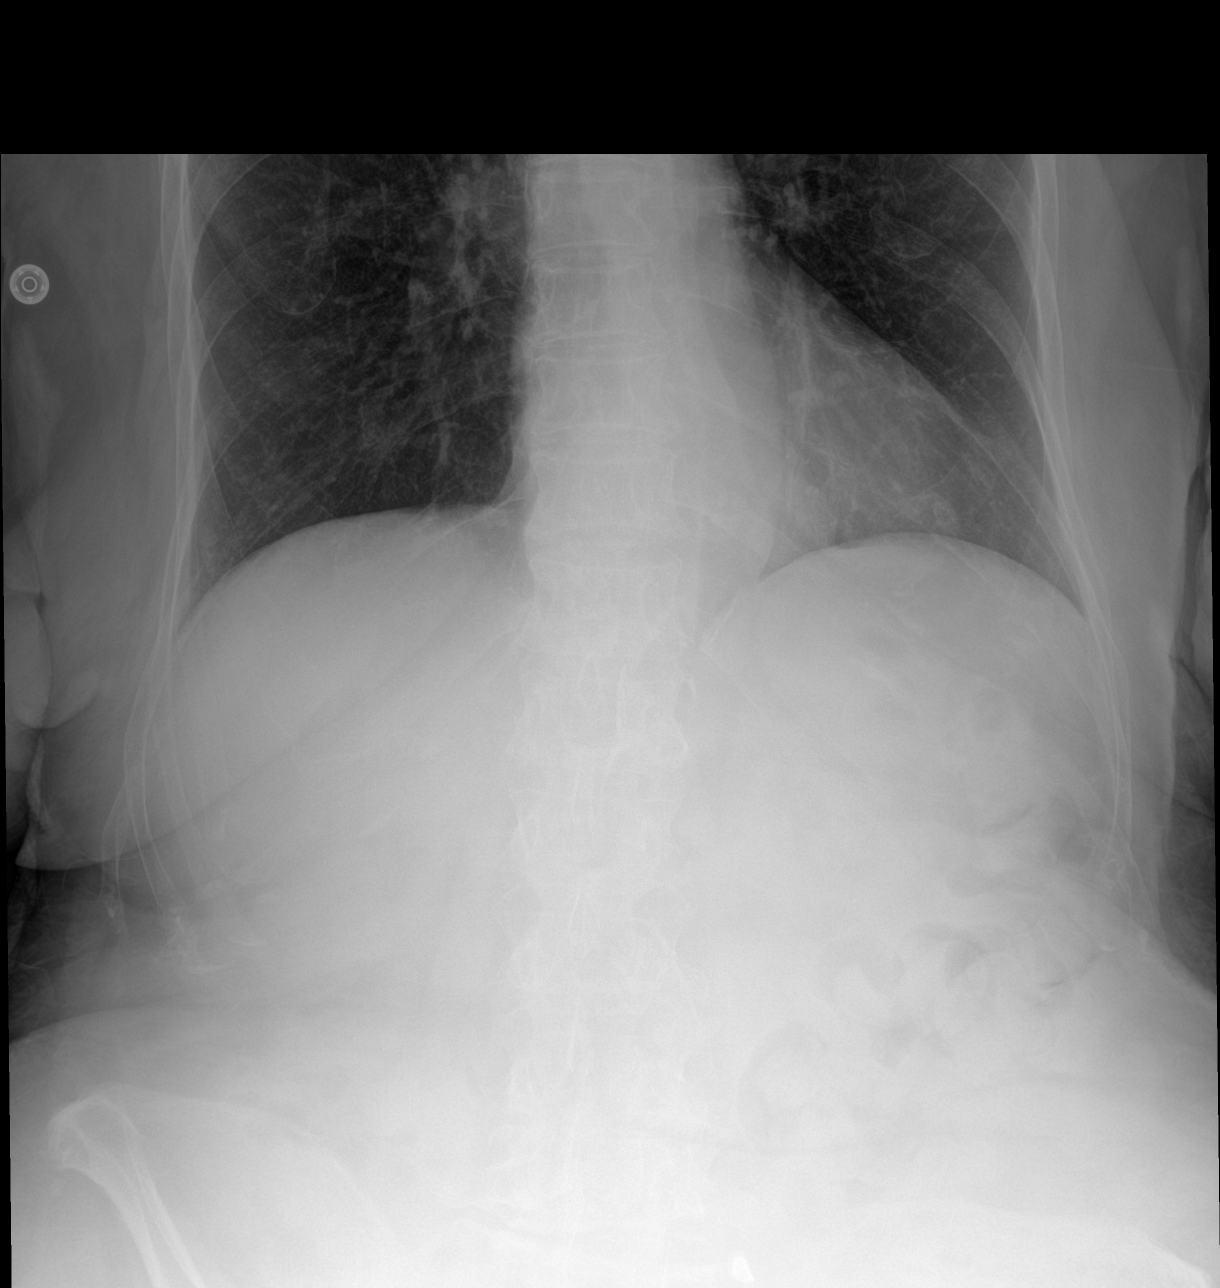

[abdomen kub (2 of 2)]
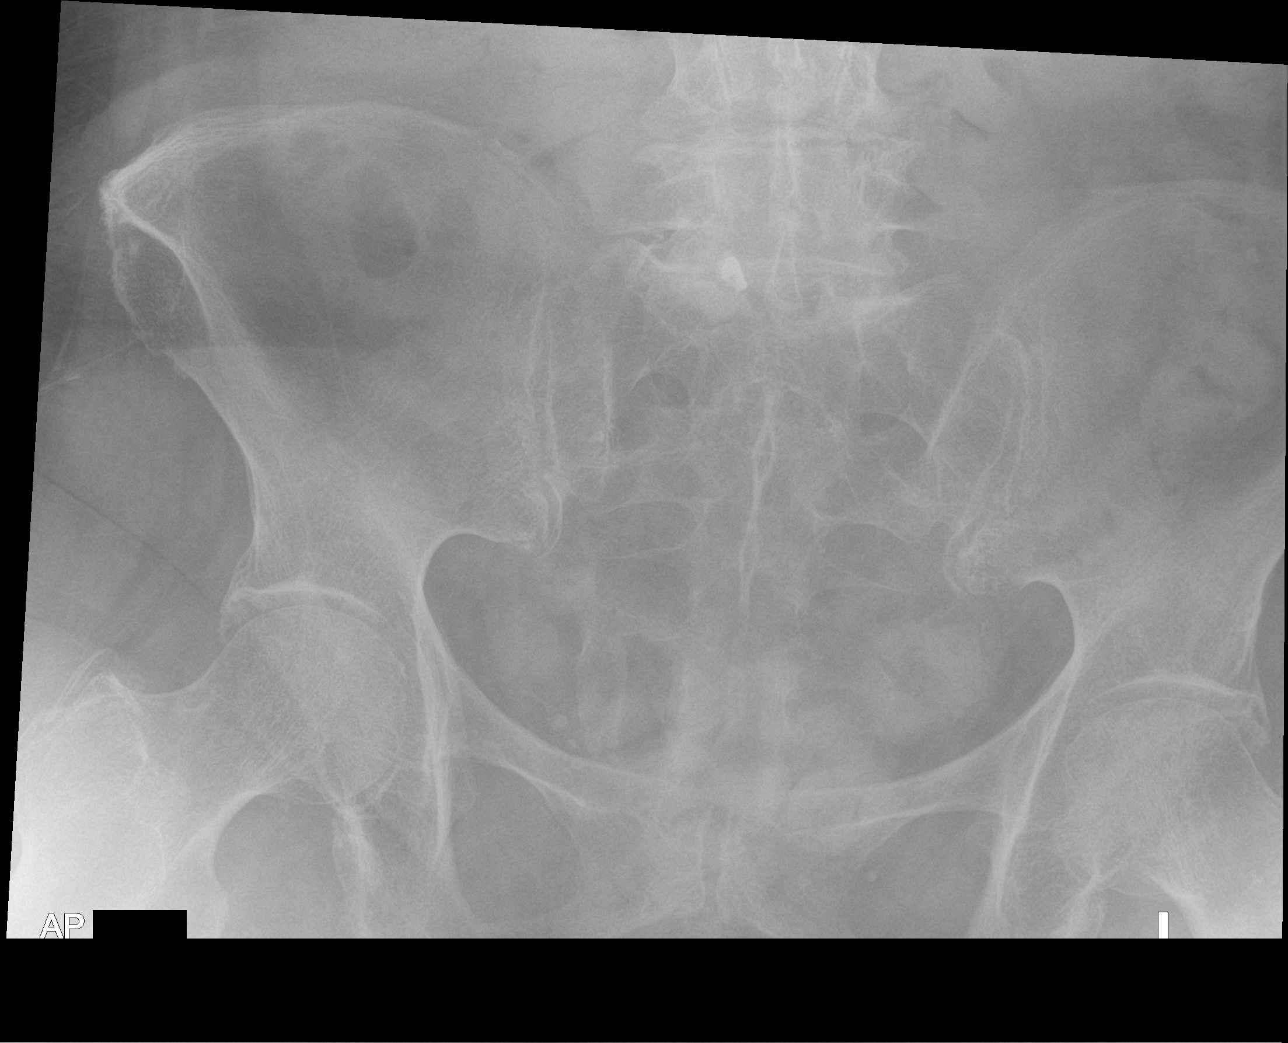

[3 of 3 positions shown; findings below may reference images not displayed]

FINDINGS: Paucity of stomach gas. Minimal small bowel gas without abnormal
distention. Minimal colonic gas without abnormal distention. Formed
stool within rectum, sigmoid colon, descending colon, and the distal
transverse colon.

No unexpected radiopaque foreign body. No unexpected soft tissue
density. No displaced fracture. Degenerative changes of the spine
IMPRESSION: Nonobstructive bowel gas pattern.

Moderate to large formed stool burden of the left colon, potentially
representing constipation.

## 2020-05-19 IMAGING — DX PORTABLE CHEST - 1 VIEW
1 series · 1 of 1 positions shown · non-contrast
Comparison: 03/15/2019

CLINICAL DATA: Shortness of breath and fluid overload

EXAM:
PORTABLE CHEST 1 VIEW

[chest ap]
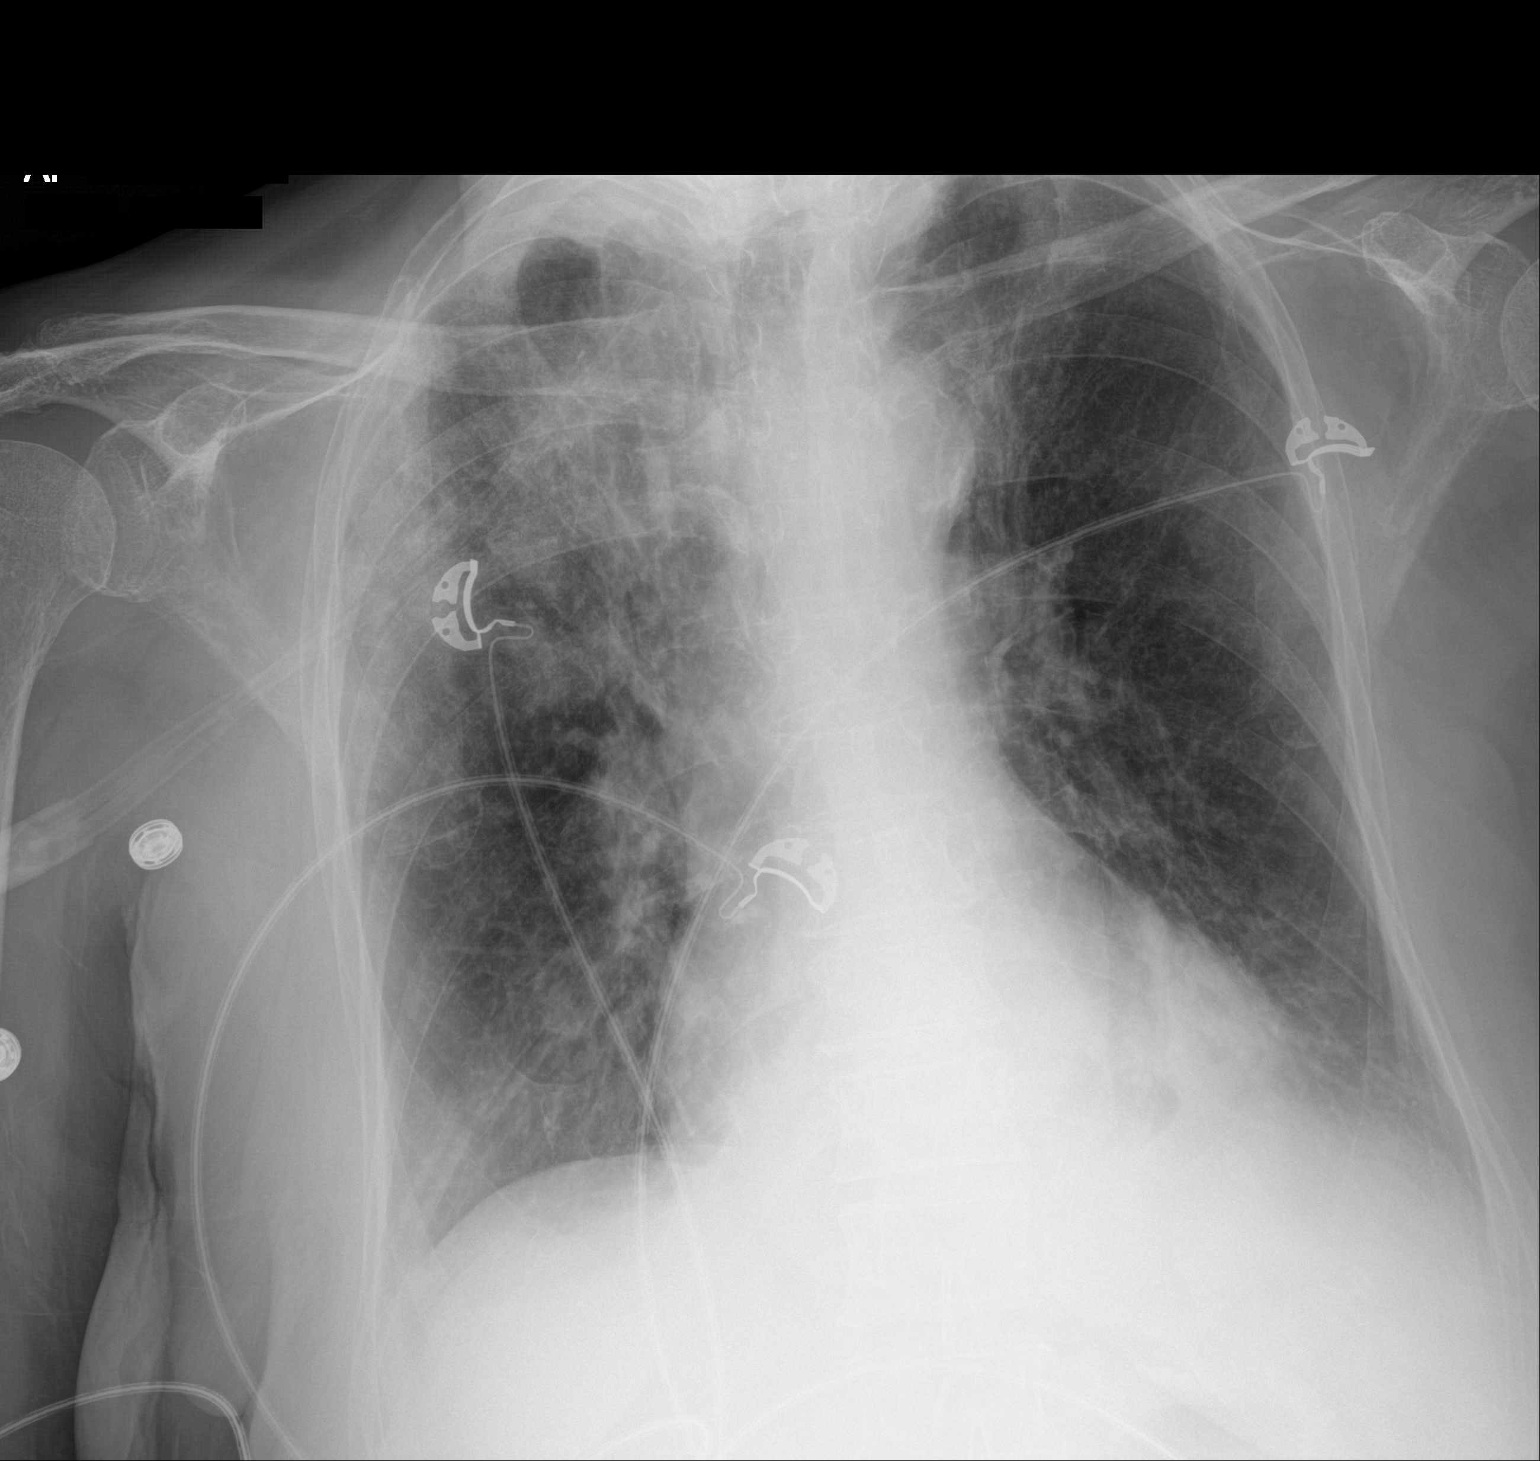

[1 of 1 positions shown; findings below may reference images not displayed]

FINDINGS: Cardiac shadow is stable. Aortic calcifications are again seen.
Persistent infiltrates are noted in the right upper lobe and left
base stable from the prior exam. No new focal abnormality is seen.
No sizable effusion is noted.
IMPRESSION: Multifocal pneumonia stable from the previous exam.

## 2020-08-15 LAB — HM DIABETES EYE EXAM

## 2022-12-21 ENCOUNTER — Other Ambulatory Visit (HOSPITAL_COMMUNITY): Payer: Self-pay
